# Patient Record
Sex: Female | Born: 1970 | Race: Black or African American | Hispanic: No | Marital: Single | Smoking: Never smoker
Health system: Southern US, Community
[De-identification: ages and names within clinical notes are randomized; demographics above are authoritative.]

## PROBLEM LIST (undated history)

## (undated) DIAGNOSIS — B20 Human immunodeficiency virus [HIV] disease: Secondary | ICD-10-CM

## (undated) DIAGNOSIS — K222 Esophageal obstruction: Secondary | ICD-10-CM

## (undated) DIAGNOSIS — F329 Major depressive disorder, single episode, unspecified: Secondary | ICD-10-CM

## (undated) DIAGNOSIS — K219 Gastro-esophageal reflux disease without esophagitis: Secondary | ICD-10-CM

## (undated) DIAGNOSIS — F32A Depression, unspecified: Secondary | ICD-10-CM

## (undated) DIAGNOSIS — Z21 Asymptomatic human immunodeficiency virus [HIV] infection status: Secondary | ICD-10-CM

## (undated) DIAGNOSIS — I10 Essential (primary) hypertension: Secondary | ICD-10-CM

## (undated) HISTORY — DX: Human immunodeficiency virus (HIV) disease: B20

## (undated) HISTORY — DX: Depression, unspecified: F32.A

## (undated) HISTORY — DX: Major depressive disorder, single episode, unspecified: F32.9

## (undated) HISTORY — DX: Asymptomatic human immunodeficiency virus (hiv) infection status: Z21

---

## 1997-11-23 ENCOUNTER — Ambulatory Visit: Admission: RE | Admit: 1997-11-23 | Discharge: 1997-11-23 | Payer: Self-pay | Admitting: Obstetrics and Gynecology

## 1997-11-29 ENCOUNTER — Encounter: Admission: RE | Admit: 1997-11-29 | Discharge: 1997-11-29 | Payer: Self-pay | Admitting: Infectious Diseases

## 1997-11-29 ENCOUNTER — Ambulatory Visit (HOSPITAL_COMMUNITY): Admission: RE | Admit: 1997-11-29 | Discharge: 1997-11-29 | Payer: Self-pay | Admitting: Infectious Diseases

## 1997-11-29 ENCOUNTER — Encounter (INDEPENDENT_AMBULATORY_CARE_PROVIDER_SITE_OTHER): Payer: Self-pay | Admitting: *Deleted

## 1997-11-29 LAB — CONVERTED CEMR LAB
CD4 Count: 260 microliters
CD4 T Cell Abs: 260

## 1997-12-06 ENCOUNTER — Encounter (HOSPITAL_COMMUNITY): Admission: RE | Admit: 1997-12-06 | Discharge: 1998-03-06 | Payer: Self-pay | Admitting: Obstetrics & Gynecology

## 1997-12-06 ENCOUNTER — Encounter: Admission: RE | Admit: 1997-12-06 | Discharge: 1997-12-06 | Payer: Self-pay | Admitting: Obstetrics & Gynecology

## 1997-12-27 ENCOUNTER — Encounter: Admission: RE | Admit: 1997-12-27 | Discharge: 1997-12-27 | Payer: Self-pay | Admitting: Obstetrics & Gynecology

## 1997-12-27 ENCOUNTER — Ambulatory Visit (HOSPITAL_COMMUNITY): Admission: RE | Admit: 1997-12-27 | Discharge: 1997-12-27 | Payer: Self-pay | Admitting: Obstetrics and Gynecology

## 1997-12-27 ENCOUNTER — Encounter: Payer: Self-pay | Admitting: Obstetrics and Gynecology

## 1998-01-03 ENCOUNTER — Encounter: Admission: RE | Admit: 1998-01-03 | Discharge: 1998-01-03 | Payer: Self-pay | Admitting: Infectious Diseases

## 1998-01-10 ENCOUNTER — Encounter: Admission: RE | Admit: 1998-01-10 | Discharge: 1998-01-10 | Payer: Self-pay | Admitting: Obstetrics & Gynecology

## 1998-01-24 ENCOUNTER — Encounter: Admission: RE | Admit: 1998-01-24 | Discharge: 1998-01-24 | Payer: Self-pay | Admitting: Obstetrics & Gynecology

## 1998-02-14 ENCOUNTER — Encounter: Admission: RE | Admit: 1998-02-14 | Discharge: 1998-02-14 | Payer: Self-pay | Admitting: Obstetrics & Gynecology

## 1998-02-28 ENCOUNTER — Encounter: Admission: RE | Admit: 1998-02-28 | Discharge: 1998-02-28 | Payer: Self-pay | Admitting: Obstetrics & Gynecology

## 1998-03-28 ENCOUNTER — Encounter: Admission: RE | Admit: 1998-03-28 | Discharge: 1998-03-28 | Payer: Self-pay | Admitting: Obstetrics & Gynecology

## 1998-04-02 ENCOUNTER — Encounter: Admission: RE | Admit: 1998-04-02 | Discharge: 1998-04-02 | Payer: Self-pay | Admitting: Infectious Diseases

## 1998-04-05 ENCOUNTER — Ambulatory Visit (HOSPITAL_COMMUNITY): Admission: RE | Admit: 1998-04-05 | Discharge: 1998-04-05 | Payer: Self-pay | Admitting: Obstetrics & Gynecology

## 1998-04-11 ENCOUNTER — Encounter: Admission: RE | Admit: 1998-04-11 | Discharge: 1998-04-11 | Payer: Self-pay | Admitting: Obstetrics & Gynecology

## 1998-04-25 ENCOUNTER — Encounter: Admission: RE | Admit: 1998-04-25 | Discharge: 1998-04-25 | Payer: Self-pay | Admitting: Obstetrics & Gynecology

## 1998-05-03 ENCOUNTER — Encounter: Admission: RE | Admit: 1998-05-03 | Discharge: 1998-05-03 | Payer: Self-pay | Admitting: Obstetrics

## 1998-05-10 ENCOUNTER — Encounter: Admission: RE | Admit: 1998-05-10 | Discharge: 1998-05-10 | Payer: Self-pay | Admitting: Obstetrics

## 1998-05-14 ENCOUNTER — Encounter: Admission: RE | Admit: 1998-05-14 | Discharge: 1998-05-14 | Payer: Self-pay | Admitting: Infectious Diseases

## 1998-05-16 ENCOUNTER — Encounter: Admission: RE | Admit: 1998-05-16 | Discharge: 1998-05-16 | Payer: Self-pay | Admitting: Obstetrics & Gynecology

## 1998-05-23 ENCOUNTER — Encounter (HOSPITAL_COMMUNITY): Admission: RE | Admit: 1998-05-23 | Discharge: 1998-05-30 | Payer: Self-pay | Admitting: Obstetrics & Gynecology

## 1998-05-23 ENCOUNTER — Encounter: Admission: RE | Admit: 1998-05-23 | Discharge: 1998-05-23 | Payer: Self-pay | Admitting: Obstetrics & Gynecology

## 1998-05-28 ENCOUNTER — Inpatient Hospital Stay (HOSPITAL_COMMUNITY): Admission: AD | Admit: 1998-05-28 | Discharge: 1998-05-28 | Payer: Self-pay | Admitting: Obstetrics and Gynecology

## 1998-05-29 ENCOUNTER — Inpatient Hospital Stay (HOSPITAL_COMMUNITY): Admission: AD | Admit: 1998-05-29 | Discharge: 1998-05-31 | Payer: Self-pay | Admitting: Obstetrics

## 1998-06-11 ENCOUNTER — Encounter: Admission: RE | Admit: 1998-06-11 | Discharge: 1998-06-11 | Payer: Self-pay | Admitting: Infectious Diseases

## 1998-07-09 ENCOUNTER — Encounter: Admission: RE | Admit: 1998-07-09 | Discharge: 1998-07-09 | Payer: Self-pay | Admitting: Family Medicine

## 1998-07-16 ENCOUNTER — Ambulatory Visit (HOSPITAL_COMMUNITY): Admission: RE | Admit: 1998-07-16 | Discharge: 1998-07-16 | Payer: Self-pay | Admitting: Infectious Diseases

## 1998-07-30 ENCOUNTER — Ambulatory Visit (HOSPITAL_COMMUNITY): Admission: RE | Admit: 1998-07-30 | Discharge: 1998-07-30 | Payer: Self-pay | Admitting: Infectious Diseases

## 1998-07-30 ENCOUNTER — Encounter: Admission: RE | Admit: 1998-07-30 | Discharge: 1998-07-30 | Payer: Self-pay | Admitting: Infectious Diseases

## 1998-10-01 ENCOUNTER — Ambulatory Visit (HOSPITAL_COMMUNITY): Admission: RE | Admit: 1998-10-01 | Discharge: 1998-10-01 | Payer: Self-pay | Admitting: Infectious Diseases

## 1998-10-01 ENCOUNTER — Encounter: Admission: RE | Admit: 1998-10-01 | Discharge: 1998-10-01 | Payer: Self-pay | Admitting: Infectious Diseases

## 1998-12-28 ENCOUNTER — Ambulatory Visit (HOSPITAL_COMMUNITY): Admission: RE | Admit: 1998-12-28 | Discharge: 1998-12-28 | Payer: Self-pay | Admitting: Infectious Diseases

## 1998-12-28 ENCOUNTER — Encounter: Admission: RE | Admit: 1998-12-28 | Discharge: 1998-12-28 | Payer: Self-pay | Admitting: Infectious Diseases

## 1999-01-23 ENCOUNTER — Encounter: Admission: RE | Admit: 1999-01-23 | Discharge: 1999-01-23 | Payer: Self-pay | Admitting: Infectious Diseases

## 1999-05-27 ENCOUNTER — Ambulatory Visit (HOSPITAL_COMMUNITY): Admission: RE | Admit: 1999-05-27 | Discharge: 1999-05-27 | Payer: Self-pay | Admitting: Obstetrics & Gynecology

## 1999-05-27 ENCOUNTER — Encounter: Admission: RE | Admit: 1999-05-27 | Discharge: 1999-05-27 | Payer: Self-pay | Admitting: Infectious Diseases

## 1999-06-26 ENCOUNTER — Encounter: Admission: RE | Admit: 1999-06-26 | Discharge: 1999-06-26 | Payer: Self-pay | Admitting: Infectious Diseases

## 1999-09-04 ENCOUNTER — Ambulatory Visit (HOSPITAL_COMMUNITY): Admission: RE | Admit: 1999-09-04 | Discharge: 1999-09-04 | Payer: Self-pay | Admitting: Obstetrics & Gynecology

## 1999-09-04 ENCOUNTER — Encounter: Admission: RE | Admit: 1999-09-04 | Discharge: 1999-09-04 | Payer: Self-pay | Admitting: Obstetrics & Gynecology

## 1999-09-18 ENCOUNTER — Encounter: Admission: RE | Admit: 1999-09-18 | Discharge: 1999-09-18 | Payer: Self-pay | Admitting: Infectious Diseases

## 2000-01-21 ENCOUNTER — Ambulatory Visit (HOSPITAL_COMMUNITY): Admission: RE | Admit: 2000-01-21 | Discharge: 2000-01-21 | Payer: Self-pay | Admitting: Infectious Diseases

## 2000-01-21 ENCOUNTER — Encounter: Admission: RE | Admit: 2000-01-21 | Discharge: 2000-01-21 | Payer: Self-pay | Admitting: Infectious Diseases

## 2000-02-04 ENCOUNTER — Encounter: Admission: RE | Admit: 2000-02-04 | Discharge: 2000-02-04 | Payer: Self-pay | Admitting: Infectious Diseases

## 2001-08-02 ENCOUNTER — Ambulatory Visit (HOSPITAL_COMMUNITY): Admission: RE | Admit: 2001-08-02 | Discharge: 2001-08-02 | Payer: Self-pay | Admitting: Infectious Diseases

## 2001-08-02 ENCOUNTER — Encounter: Admission: RE | Admit: 2001-08-02 | Discharge: 2001-08-02 | Payer: Self-pay | Admitting: Infectious Diseases

## 2001-08-16 ENCOUNTER — Encounter: Payer: Self-pay | Admitting: Infectious Diseases

## 2001-08-16 ENCOUNTER — Encounter: Admission: RE | Admit: 2001-08-16 | Discharge: 2001-08-16 | Payer: Self-pay | Admitting: Internal Medicine

## 2001-08-16 ENCOUNTER — Ambulatory Visit (HOSPITAL_COMMUNITY): Admission: RE | Admit: 2001-08-16 | Discharge: 2001-08-16 | Payer: Self-pay | Admitting: Infectious Diseases

## 2001-10-04 ENCOUNTER — Ambulatory Visit (HOSPITAL_COMMUNITY): Admission: RE | Admit: 2001-10-04 | Discharge: 2001-10-04 | Payer: Self-pay | Admitting: Infectious Diseases

## 2001-10-04 ENCOUNTER — Encounter: Admission: RE | Admit: 2001-10-04 | Discharge: 2001-10-04 | Payer: Self-pay | Admitting: Infectious Diseases

## 2001-11-18 ENCOUNTER — Encounter: Admission: RE | Admit: 2001-11-18 | Discharge: 2001-11-18 | Payer: Self-pay | Admitting: Infectious Diseases

## 2002-01-18 ENCOUNTER — Encounter: Admission: RE | Admit: 2002-01-18 | Discharge: 2002-01-18 | Payer: Self-pay | Admitting: Infectious Diseases

## 2002-01-18 ENCOUNTER — Ambulatory Visit (HOSPITAL_COMMUNITY): Admission: RE | Admit: 2002-01-18 | Discharge: 2002-01-18 | Payer: Self-pay | Admitting: Infectious Diseases

## 2002-04-18 ENCOUNTER — Encounter: Admission: RE | Admit: 2002-04-18 | Discharge: 2002-04-18 | Payer: Self-pay | Admitting: Infectious Diseases

## 2002-05-06 ENCOUNTER — Encounter: Admission: RE | Admit: 2002-05-06 | Discharge: 2002-05-06 | Payer: Self-pay | Admitting: Infectious Diseases

## 2002-05-16 ENCOUNTER — Encounter: Admission: RE | Admit: 2002-05-16 | Discharge: 2002-05-16 | Payer: Self-pay | Admitting: Infectious Diseases

## 2002-08-29 ENCOUNTER — Encounter: Admission: RE | Admit: 2002-08-29 | Discharge: 2002-08-29 | Payer: Self-pay | Admitting: Infectious Diseases

## 2002-10-10 ENCOUNTER — Encounter: Admission: RE | Admit: 2002-10-10 | Discharge: 2002-10-10 | Payer: Self-pay | Admitting: Infectious Diseases

## 2002-10-10 ENCOUNTER — Ambulatory Visit (HOSPITAL_COMMUNITY): Admission: RE | Admit: 2002-10-10 | Discharge: 2002-10-10 | Payer: Self-pay | Admitting: Infectious Diseases

## 2002-10-10 ENCOUNTER — Encounter (INDEPENDENT_AMBULATORY_CARE_PROVIDER_SITE_OTHER): Payer: Self-pay | Admitting: Infectious Diseases

## 2003-06-26 ENCOUNTER — Encounter: Admission: RE | Admit: 2003-06-26 | Discharge: 2003-06-26 | Payer: Self-pay | Admitting: Infectious Diseases

## 2003-07-12 ENCOUNTER — Encounter: Admission: RE | Admit: 2003-07-12 | Discharge: 2003-07-12 | Payer: Self-pay | Admitting: Infectious Diseases

## 2003-10-03 ENCOUNTER — Encounter: Admission: RE | Admit: 2003-10-03 | Discharge: 2003-10-03 | Payer: Self-pay | Admitting: Infectious Diseases

## 2003-10-03 ENCOUNTER — Ambulatory Visit (HOSPITAL_COMMUNITY): Admission: RE | Admit: 2003-10-03 | Discharge: 2003-10-03 | Payer: Self-pay | Admitting: Infectious Diseases

## 2003-10-25 ENCOUNTER — Ambulatory Visit: Payer: Self-pay | Admitting: Infectious Diseases

## 2003-12-05 ENCOUNTER — Ambulatory Visit: Payer: Self-pay | Admitting: Infectious Diseases

## 2004-02-13 ENCOUNTER — Ambulatory Visit (HOSPITAL_COMMUNITY): Admission: RE | Admit: 2004-02-13 | Discharge: 2004-02-13 | Payer: Self-pay | Admitting: Infectious Diseases

## 2004-02-13 ENCOUNTER — Ambulatory Visit: Payer: Self-pay | Admitting: Infectious Diseases

## 2004-04-01 ENCOUNTER — Ambulatory Visit: Payer: Self-pay | Admitting: Infectious Diseases

## 2004-07-30 ENCOUNTER — Ambulatory Visit: Payer: Self-pay | Admitting: Infectious Diseases

## 2004-07-30 ENCOUNTER — Ambulatory Visit (HOSPITAL_COMMUNITY): Admission: RE | Admit: 2004-07-30 | Discharge: 2004-07-30 | Payer: Self-pay | Admitting: Infectious Diseases

## 2004-08-26 ENCOUNTER — Ambulatory Visit: Payer: Self-pay | Admitting: Infectious Diseases

## 2004-11-11 ENCOUNTER — Ambulatory Visit: Payer: Self-pay | Admitting: Infectious Diseases

## 2004-11-11 ENCOUNTER — Ambulatory Visit (HOSPITAL_COMMUNITY): Admission: RE | Admit: 2004-11-11 | Discharge: 2004-11-11 | Payer: Self-pay | Admitting: Infectious Diseases

## 2005-01-27 ENCOUNTER — Ambulatory Visit: Payer: Self-pay | Admitting: Infectious Diseases

## 2005-05-14 ENCOUNTER — Encounter (INDEPENDENT_AMBULATORY_CARE_PROVIDER_SITE_OTHER): Payer: Self-pay | Admitting: *Deleted

## 2005-05-14 ENCOUNTER — Ambulatory Visit: Payer: Self-pay | Admitting: Infectious Diseases

## 2005-05-14 ENCOUNTER — Encounter: Admission: RE | Admit: 2005-05-14 | Discharge: 2005-05-14 | Payer: Self-pay | Admitting: Infectious Diseases

## 2005-05-14 LAB — CONVERTED CEMR LAB
CD4 Count: 570 microliters
HIV 1 RNA Quant: 49 copies/mL

## 2005-06-09 ENCOUNTER — Ambulatory Visit: Payer: Self-pay | Admitting: Infectious Diseases

## 2005-09-29 ENCOUNTER — Ambulatory Visit: Payer: Self-pay | Admitting: Infectious Diseases

## 2005-09-29 ENCOUNTER — Encounter: Admission: RE | Admit: 2005-09-29 | Discharge: 2005-09-29 | Payer: Self-pay | Admitting: Infectious Diseases

## 2005-09-29 ENCOUNTER — Encounter (INDEPENDENT_AMBULATORY_CARE_PROVIDER_SITE_OTHER): Payer: Self-pay | Admitting: *Deleted

## 2005-09-29 LAB — CONVERTED CEMR LAB
CD4 Count: 550 microliters
HIV 1 RNA Quant: 71 copies/mL

## 2005-11-03 ENCOUNTER — Ambulatory Visit: Payer: Self-pay | Admitting: Infectious Diseases

## 2005-11-19 DIAGNOSIS — B20 Human immunodeficiency virus [HIV] disease: Secondary | ICD-10-CM | POA: Insufficient documentation

## 2006-02-23 ENCOUNTER — Encounter (INDEPENDENT_AMBULATORY_CARE_PROVIDER_SITE_OTHER): Payer: Self-pay | Admitting: *Deleted

## 2006-02-23 ENCOUNTER — Ambulatory Visit: Payer: Self-pay | Admitting: Infectious Diseases

## 2006-02-23 ENCOUNTER — Encounter: Admission: RE | Admit: 2006-02-23 | Discharge: 2006-02-23 | Payer: Self-pay | Admitting: Infectious Diseases

## 2006-02-23 LAB — CONVERTED CEMR LAB
ALT: 14 units/L (ref 0–35)
AST: 14 units/L (ref 0–37)
Albumin: 4 g/dL (ref 3.5–5.2)
Alkaline Phosphatase: 82 units/L (ref 39–117)
BUN: 12 mg/dL (ref 6–23)
Basophils Absolute: 0 10*3/uL (ref 0.0–0.1)
Basophils Relative: 0 % (ref 0–1)
Bilirubin Urine: NEGATIVE
CD4 Count: 780 microliters
CO2: 24 meq/L (ref 19–32)
Calcium: 9.1 mg/dL (ref 8.4–10.5)
Chloride: 103 meq/L (ref 96–112)
Cholesterol: 205 mg/dL — ABNORMAL HIGH (ref 0–200)
Creatinine, Ser: 0.85 mg/dL (ref 0.40–1.20)
Eosinophils Relative: 2 % (ref 0–5)
Glucose, Bld: 114 mg/dL — ABNORMAL HIGH (ref 70–99)
HCT: 39.9 % (ref 36.0–46.0)
HDL: 39 mg/dL — ABNORMAL LOW (ref >39)
HIV 1 RNA Quant: 549 copies/mL
HIV 1 RNA Quant: 549 copies/mL — ABNORMAL HIGH (ref <50)
HIV-1 RNA Quant, Log: 2.74 — ABNORMAL HIGH (ref <1.70)
Hemoglobin, Urine: NEGATIVE
Hemoglobin: 12.3 g/dL (ref 12.0–15.0)
LDL Cholesterol: 135 mg/dL — ABNORMAL HIGH (ref 0–99)
Lymphocytes Relative: 47 % — ABNORMAL HIGH (ref 12–46)
Lymphs Abs: 3.2 10*3/uL (ref 0.7–3.3)
MCHC: 30.8 g/dL (ref 30.0–36.0)
MCV: 83.6 fL (ref 78.0–100.0)
Monocytes Absolute: 0.5 10*3/uL (ref 0.2–0.7)
Monocytes Relative: 7 % (ref 3–11)
Neutro Abs: 3 10*3/uL (ref 1.7–7.7)
Neutrophils Relative %: 44 % (ref 43–77)
Nitrite: NEGATIVE
Platelets: 312 10*3/uL (ref 150–400)
Potassium: 4.3 meq/L (ref 3.5–5.3)
Protein, ur: NEGATIVE mg/dL
RBC / HPF: NONE SEEN (ref <3)
RBC: 4.77 M/uL (ref 3.87–5.11)
RDW: 14.7 % — ABNORMAL HIGH (ref 11.5–14.0)
Sodium: 139 meq/L (ref 135–145)
Specific Gravity, Urine: 1.026 (ref 1.005–1.03)
Total Bilirubin: 0.3 mg/dL (ref 0.3–1.2)
Total CHOL/HDL Ratio: 5.3
Total Protein: 7.7 g/dL (ref 6.0–8.3)
Triglycerides: 154 mg/dL — ABNORMAL HIGH (ref <150)
Urine Glucose: NEGATIVE mg/dL
Urobilinogen, UA: 0.2 (ref 0.0–1.0)
VLDL: 31 mg/dL (ref 0–40)
WBC: 6.8 10*3/uL (ref 4.0–10.5)
pH: 6 (ref 5.0–8.0)

## 2006-03-16 ENCOUNTER — Ambulatory Visit: Payer: Self-pay | Admitting: Infectious Diseases

## 2006-04-13 ENCOUNTER — Encounter (INDEPENDENT_AMBULATORY_CARE_PROVIDER_SITE_OTHER): Payer: Self-pay | Admitting: *Deleted

## 2006-04-13 LAB — CONVERTED CEMR LAB

## 2006-04-26 ENCOUNTER — Encounter (INDEPENDENT_AMBULATORY_CARE_PROVIDER_SITE_OTHER): Payer: Self-pay | Admitting: *Deleted

## 2006-12-11 ENCOUNTER — Ambulatory Visit: Payer: Self-pay | Admitting: Internal Medicine

## 2006-12-11 ENCOUNTER — Encounter: Admission: RE | Admit: 2006-12-11 | Discharge: 2006-12-11 | Payer: Self-pay | Admitting: Internal Medicine

## 2006-12-11 LAB — CONVERTED CEMR LAB
ALT: 13 units/L (ref 0–35)
AST: 13 units/L (ref 0–37)
Albumin: 3.7 g/dL (ref 3.5–5.2)
Alkaline Phosphatase: 76 units/L (ref 39–117)
BUN: 8 mg/dL (ref 6–23)
Basophils Absolute: 0 10*3/uL (ref 0.0–0.1)
Basophils Relative: 0 % (ref 0–1)
CO2: 25 meq/L (ref 19–32)
Calcium: 8.5 mg/dL (ref 8.4–10.5)
Chloride: 102 meq/L (ref 96–112)
Creatinine, Ser: 0.75 mg/dL (ref 0.40–1.20)
Eosinophils Absolute: 0.1 10*3/uL (ref 0.0–0.7)
Eosinophils Relative: 2 % (ref 0–5)
Glucose, Bld: 152 mg/dL — ABNORMAL HIGH (ref 70–99)
HCT: 38.3 % (ref 36.0–46.0)
HIV 1 RNA Quant: 326 copies/mL — ABNORMAL HIGH (ref <50)
HIV-1 RNA Quant, Log: 2.51 — ABNORMAL HIGH (ref <1.70)
Hemoglobin: 12.4 g/dL (ref 12.0–15.0)
Hep B Core Total Ab: NEGATIVE
Lymphocytes Relative: 48 % — ABNORMAL HIGH (ref 12–46)
Lymphs Abs: 2.9 10*3/uL (ref 0.7–3.3)
MCHC: 32.4 g/dL (ref 30.0–36.0)
MCV: 80.8 fL (ref 78.0–100.0)
Monocytes Absolute: 0.5 10*3/uL (ref 0.2–0.7)
Monocytes Relative: 8 % (ref 3–11)
Neutro Abs: 2.6 10*3/uL (ref 1.7–7.7)
Neutrophils Relative %: 42 % — ABNORMAL LOW (ref 43–77)
Platelets: 320 10*3/uL (ref 150–400)
Potassium: 3.9 meq/L (ref 3.5–5.3)
RBC: 4.74 M/uL (ref 3.87–5.11)
RDW: 14.4 % — ABNORMAL HIGH (ref 11.5–14.0)
Sodium: 138 meq/L (ref 135–145)
Total Bilirubin: 0.3 mg/dL (ref 0.3–1.2)
Total Protein: 7.1 g/dL (ref 6.0–8.3)
WBC: 6.1 10*3/uL (ref 4.0–10.5)

## 2007-01-19 ENCOUNTER — Ambulatory Visit: Payer: Self-pay | Admitting: Internal Medicine

## 2007-01-19 DIAGNOSIS — I10 Essential (primary) hypertension: Secondary | ICD-10-CM | POA: Insufficient documentation

## 2007-01-19 DIAGNOSIS — F32A Depression, unspecified: Secondary | ICD-10-CM | POA: Insufficient documentation

## 2007-01-19 DIAGNOSIS — R7309 Other abnormal glucose: Secondary | ICD-10-CM | POA: Insufficient documentation

## 2007-01-19 DIAGNOSIS — F329 Major depressive disorder, single episode, unspecified: Secondary | ICD-10-CM | POA: Insufficient documentation

## 2007-01-27 ENCOUNTER — Telehealth: Payer: Self-pay | Admitting: Internal Medicine

## 2007-02-18 HISTORY — PX: GASTRIC BYPASS: SHX52

## 2007-03-03 ENCOUNTER — Telehealth: Payer: Self-pay | Admitting: Internal Medicine

## 2007-03-03 ENCOUNTER — Telehealth (INDEPENDENT_AMBULATORY_CARE_PROVIDER_SITE_OTHER): Payer: Self-pay | Admitting: *Deleted

## 2007-03-19 ENCOUNTER — Telehealth: Payer: Self-pay | Admitting: Internal Medicine

## 2007-04-29 ENCOUNTER — Encounter (INDEPENDENT_AMBULATORY_CARE_PROVIDER_SITE_OTHER): Payer: Self-pay | Admitting: *Deleted

## 2007-05-07 ENCOUNTER — Encounter: Admission: RE | Admit: 2007-05-07 | Discharge: 2007-05-07 | Payer: Self-pay | Admitting: Internal Medicine

## 2007-05-07 ENCOUNTER — Encounter: Payer: Self-pay | Admitting: Internal Medicine

## 2007-05-07 ENCOUNTER — Ambulatory Visit: Payer: Self-pay | Admitting: Internal Medicine

## 2007-05-07 LAB — CONVERTED CEMR LAB
ALT: 19 units/L (ref 0–35)
AST: 17 units/L (ref 0–37)
Albumin: 3.8 g/dL (ref 3.5–5.2)
Alkaline Phosphatase: 52 units/L (ref 39–117)
BUN: 8 mg/dL (ref 6–23)
Basophils Absolute: 0 10*3/uL (ref 0.0–0.1)
Basophils Relative: 1 % (ref 0–1)
CO2: 25 meq/L (ref 19–32)
Calcium: 8.4 mg/dL (ref 8.4–10.5)
Chloride: 105 meq/L (ref 96–112)
Creatinine, Ser: 0.86 mg/dL (ref 0.40–1.20)
Eosinophils Absolute: 0 10*3/uL (ref 0.0–0.7)
Eosinophils Relative: 1 % (ref 0–5)
Glucose, Bld: 88 mg/dL (ref 70–99)
HCT: 37.6 % (ref 36.0–46.0)
HIV 1 RNA Quant: 19900 copies/mL — ABNORMAL HIGH (ref <50)
HIV-1 RNA Quant, Log: 4.3 — ABNORMAL HIGH (ref <1.70)
Hemoglobin: 12 g/dL (ref 12.0–15.0)
Lymphocytes Relative: 60 % — ABNORMAL HIGH (ref 12–46)
Lymphs Abs: 2.5 10*3/uL (ref 0.7–4.0)
MCHC: 31.9 g/dL (ref 30.0–36.0)
MCV: 80.7 fL (ref 78.0–100.0)
Monocytes Absolute: 0.8 10*3/uL (ref 0.1–1.0)
Monocytes Relative: 18 % — ABNORMAL HIGH (ref 3–12)
Neutro Abs: 0.8 10*3/uL — ABNORMAL LOW (ref 1.7–7.7)
Neutrophils Relative %: 20 % — ABNORMAL LOW (ref 43–77)
Pap Smear: NORMAL
Platelets: 264 10*3/uL (ref 150–400)
Potassium: 4.3 meq/L (ref 3.5–5.3)
RBC: 4.66 M/uL (ref 3.87–5.11)
RDW: 15.3 % (ref 11.5–15.5)
Sodium: 140 meq/L (ref 135–145)
Total Bilirubin: 0.2 mg/dL — ABNORMAL LOW (ref 0.3–1.2)
Total Protein: 7.6 g/dL (ref 6.0–8.3)
WBC: 4.1 10*3/uL (ref 4.0–10.5)

## 2007-05-17 ENCOUNTER — Encounter: Payer: Self-pay | Admitting: Internal Medicine

## 2007-05-21 ENCOUNTER — Ambulatory Visit: Payer: Self-pay | Admitting: Internal Medicine

## 2007-06-04 ENCOUNTER — Telehealth: Payer: Self-pay | Admitting: Internal Medicine

## 2007-06-04 ENCOUNTER — Telehealth: Payer: Self-pay

## 2007-09-08 ENCOUNTER — Encounter (INDEPENDENT_AMBULATORY_CARE_PROVIDER_SITE_OTHER): Payer: Self-pay | Admitting: *Deleted

## 2007-12-16 ENCOUNTER — Ambulatory Visit: Payer: Self-pay | Admitting: Internal Medicine

## 2007-12-16 LAB — CONVERTED CEMR LAB
ALT: 15 units/L (ref 0–35)
AST: 14 units/L (ref 0–37)
Albumin: 3.7 g/dL (ref 3.5–5.2)
Alkaline Phosphatase: 53 units/L (ref 39–117)
BUN: 13 mg/dL (ref 6–23)
Basophils Absolute: 0 10*3/uL (ref 0.0–0.1)
Basophils Relative: 0 % (ref 0–1)
CO2: 26 meq/L (ref 19–32)
Calcium: 9.2 mg/dL (ref 8.4–10.5)
Chloride: 102 meq/L (ref 96–112)
Cholesterol: 220 mg/dL — ABNORMAL HIGH (ref 0–200)
Creatinine, Ser: 0.81 mg/dL (ref 0.40–1.20)
Eosinophils Absolute: 0 10*3/uL (ref 0.0–0.7)
Eosinophils Relative: 1 % (ref 0–5)
Glucose, Bld: 140 mg/dL — ABNORMAL HIGH (ref 70–99)
HCT: 36.7 % (ref 36.0–46.0)
HDL: 40 mg/dL (ref >39)
HIV 1 RNA Quant: 410 copies/mL — ABNORMAL HIGH (ref <50)
HIV-1 RNA Quant, Log: 2.61 — ABNORMAL HIGH (ref <1.70)
Hemoglobin: 11.4 g/dL — ABNORMAL LOW (ref 12.0–15.0)
Hgb A1c MFr Bld: 7 %
LDL Cholesterol: 151 mg/dL — ABNORMAL HIGH (ref 0–99)
Lymphocytes Relative: 52 % — ABNORMAL HIGH (ref 12–46)
Lymphs Abs: 2.8 10*3/uL (ref 0.7–4.0)
MCHC: 31.1 g/dL (ref 30.0–36.0)
MCV: 83 fL (ref 78.0–100.0)
Monocytes Absolute: 0.5 10*3/uL (ref 0.1–1.0)
Monocytes Relative: 9 % (ref 3–12)
Neutro Abs: 2.1 10*3/uL (ref 1.7–7.7)
Neutrophils Relative %: 39 % — ABNORMAL LOW (ref 43–77)
Platelets: 335 10*3/uL (ref 150–400)
Potassium: 4.7 meq/L (ref 3.5–5.3)
RBC: 4.42 M/uL (ref 3.87–5.11)
RDW: 14.6 % (ref 11.5–15.5)
Sodium: 138 meq/L (ref 135–145)
Total Bilirubin: 0.3 mg/dL (ref 0.3–1.2)
Total CHOL/HDL Ratio: 5.5
Total Protein: 7.6 g/dL (ref 6.0–8.3)
Triglycerides: 146 mg/dL (ref <150)
VLDL: 29 mg/dL (ref 0–40)
WBC: 5.3 10*3/uL (ref 4.0–10.5)

## 2007-12-28 ENCOUNTER — Encounter: Payer: Self-pay | Admitting: Internal Medicine

## 2008-01-04 ENCOUNTER — Ambulatory Visit: Payer: Self-pay | Admitting: Internal Medicine

## 2008-01-04 DIAGNOSIS — L293 Anogenital pruritus, unspecified: Secondary | ICD-10-CM | POA: Insufficient documentation

## 2008-01-04 DIAGNOSIS — E785 Hyperlipidemia, unspecified: Secondary | ICD-10-CM | POA: Insufficient documentation

## 2008-02-16 ENCOUNTER — Encounter: Payer: Self-pay | Admitting: Internal Medicine

## 2008-05-17 ENCOUNTER — Encounter: Payer: Self-pay | Admitting: Internal Medicine

## 2008-05-17 ENCOUNTER — Telehealth (INDEPENDENT_AMBULATORY_CARE_PROVIDER_SITE_OTHER): Payer: Self-pay | Admitting: *Deleted

## 2008-05-26 ENCOUNTER — Encounter: Payer: Self-pay | Admitting: Internal Medicine

## 2008-05-26 ENCOUNTER — Ambulatory Visit: Payer: Self-pay | Admitting: Internal Medicine

## 2008-05-26 LAB — CONVERTED CEMR LAB
ALT: 12 units/L (ref 0–35)
AST: 12 units/L (ref 0–37)
Albumin: 3.6 g/dL (ref 3.5–5.2)
Alkaline Phosphatase: 63 units/L (ref 39–117)
BUN: 11 mg/dL (ref 6–23)
Basophils Absolute: 0 10*3/uL (ref 0.0–0.1)
Basophils Relative: 0 % (ref 0–1)
CO2: 27 meq/L (ref 19–32)
Calcium: 8.7 mg/dL (ref 8.4–10.5)
Chloride: 101 meq/L (ref 96–112)
Creatinine, Ser: 0.84 mg/dL (ref 0.40–1.20)
Eosinophils Absolute: 0.2 10*3/uL (ref 0.0–0.7)
Eosinophils Relative: 3 % (ref 0–5)
GFR calc Af Amer: >60 mL/min (ref >60)
GFR calc non Af Amer: >60 mL/min (ref >60)
Glucose, Bld: 131 mg/dL — ABNORMAL HIGH (ref 70–99)
HCT: 35.5 % — ABNORMAL LOW (ref 36.0–46.0)
HIV 1 RNA Quant: <48 copies/mL (ref <48)
HIV-1 RNA Quant, Log: <1.68 (ref <1.68)
Hemoglobin: 11.4 g/dL — ABNORMAL LOW (ref 12.0–15.0)
Lymphocytes Relative: 36 % (ref 12–46)
Lymphs Abs: 2.6 10*3/uL (ref 0.7–4.0)
MCHC: 32.1 g/dL (ref 30.0–36.0)
MCV: 82.6 fL (ref 78.0–100.0)
Monocytes Absolute: 0.8 10*3/uL (ref 0.1–1.0)
Monocytes Relative: 10 % (ref 3–12)
Neutro Abs: 3.8 10*3/uL (ref 1.7–7.7)
Neutrophils Relative %: 51 % (ref 43–77)
Platelets: 332 10*3/uL (ref 150–400)
Potassium: 4 meq/L (ref 3.5–5.3)
RBC: 4.3 M/uL (ref 3.87–5.11)
RDW: 15.2 % (ref 11.5–15.5)
Sodium: 137 meq/L (ref 135–145)
Total Bilirubin: 0.2 mg/dL — ABNORMAL LOW (ref 0.3–1.2)
Total Protein: 7.3 g/dL (ref 6.0–8.3)
WBC: 7.4 10*3/uL (ref 4.0–10.5)

## 2008-06-02 ENCOUNTER — Encounter: Payer: Self-pay | Admitting: Internal Medicine

## 2008-06-02 ENCOUNTER — Telehealth: Payer: Self-pay | Admitting: Internal Medicine

## 2008-06-06 ENCOUNTER — Encounter: Payer: Self-pay | Admitting: Internal Medicine

## 2008-06-09 ENCOUNTER — Ambulatory Visit: Payer: Self-pay | Admitting: Internal Medicine

## 2008-06-09 DIAGNOSIS — E669 Obesity, unspecified: Secondary | ICD-10-CM | POA: Insufficient documentation

## 2008-06-28 ENCOUNTER — Encounter: Payer: Self-pay | Admitting: Internal Medicine

## 2008-09-06 ENCOUNTER — Ambulatory Visit: Payer: Self-pay | Admitting: Internal Medicine

## 2008-09-06 LAB — CONVERTED CEMR LAB
ALT: 12 units/L (ref 0–35)
AST: 12 units/L (ref 0–37)
Albumin: 3.7 g/dL (ref 3.5–5.2)
Alkaline Phosphatase: 52 units/L (ref 39–117)
BUN: 12 mg/dL (ref 6–23)
Basophils Absolute: 0 10*3/uL (ref 0.0–0.1)
Basophils Relative: 0 % (ref 0–1)
CO2: 25 meq/L (ref 19–32)
Calcium: 9.1 mg/dL (ref 8.4–10.5)
Chloride: 103 meq/L (ref 96–112)
Creatinine, Ser: 1.02 mg/dL (ref 0.40–1.20)
Eosinophils Absolute: 0.1 10*3/uL (ref 0.0–0.7)
Eosinophils Relative: 1 % (ref 0–5)
Glucose, Bld: 190 mg/dL — ABNORMAL HIGH (ref 70–99)
HCT: 36.2 % (ref 36.0–46.0)
HIV 1 RNA Quant: 107 copies/mL — ABNORMAL HIGH (ref <48)
HIV-1 RNA Quant, Log: 2.03 — ABNORMAL HIGH (ref <1.68)
Hemoglobin: 11.7 g/dL — ABNORMAL LOW (ref 12.0–15.0)
Lymphocytes Relative: 40 % (ref 12–46)
Lymphs Abs: 3 10*3/uL (ref 0.7–4.0)
MCHC: 32.3 g/dL (ref 30.0–36.0)
MCV: 82.6 fL (ref 78.0–100.0)
Monocytes Absolute: 0.6 10*3/uL (ref 0.1–1.0)
Monocytes Relative: 8 % (ref 3–12)
Neutro Abs: 4 10*3/uL (ref 1.7–7.7)
Neutrophils Relative %: 52 % (ref 43–77)
Platelets: 333 10*3/uL (ref 150–400)
Potassium: 4.3 meq/L (ref 3.5–5.3)
RBC: 4.38 M/uL (ref 3.87–5.11)
RDW: 15.5 % (ref 11.5–15.5)
Sodium: 137 meq/L (ref 135–145)
Total Bilirubin: 0.3 mg/dL (ref 0.3–1.2)
Total Protein: 7.3 g/dL (ref 6.0–8.3)
WBC: 7.6 10*3/uL (ref 4.0–10.5)

## 2008-09-22 ENCOUNTER — Encounter (INDEPENDENT_AMBULATORY_CARE_PROVIDER_SITE_OTHER): Payer: Self-pay | Admitting: *Deleted

## 2008-09-22 ENCOUNTER — Ambulatory Visit: Payer: Self-pay | Admitting: Internal Medicine

## 2009-01-05 ENCOUNTER — Encounter: Payer: Self-pay | Admitting: Internal Medicine

## 2009-01-09 ENCOUNTER — Ambulatory Visit: Payer: Self-pay | Admitting: Internal Medicine

## 2009-01-09 LAB — CONVERTED CEMR LAB
HIV 1 RNA Quant: <48 copies/mL (ref <48)
HIV-1 RNA Quant, Log: <1.68 (ref <1.68)

## 2009-01-24 ENCOUNTER — Ambulatory Visit: Payer: Self-pay | Admitting: Obstetrics and Gynecology

## 2009-01-24 ENCOUNTER — Encounter: Payer: Self-pay | Admitting: Internal Medicine

## 2009-01-24 ENCOUNTER — Encounter (INDEPENDENT_AMBULATORY_CARE_PROVIDER_SITE_OTHER): Payer: Self-pay | Admitting: *Deleted

## 2009-01-24 LAB — CONVERTED CEMR LAB
Pap Smear: NEGATIVE
Pap Smear: NORMAL

## 2009-01-31 ENCOUNTER — Encounter (INDEPENDENT_AMBULATORY_CARE_PROVIDER_SITE_OTHER): Payer: Self-pay | Admitting: *Deleted

## 2009-02-02 ENCOUNTER — Encounter (INDEPENDENT_AMBULATORY_CARE_PROVIDER_SITE_OTHER): Payer: Self-pay | Admitting: *Deleted

## 2009-03-15 ENCOUNTER — Encounter: Payer: Self-pay | Admitting: Internal Medicine

## 2009-03-21 ENCOUNTER — Encounter: Payer: Self-pay | Admitting: Internal Medicine

## 2009-06-14 ENCOUNTER — Ambulatory Visit: Payer: Self-pay | Admitting: Internal Medicine

## 2009-06-14 LAB — CONVERTED CEMR LAB
ALT: 12 units/L (ref 0–35)
AST: 19 units/L (ref 0–37)
Albumin: 3.2 g/dL — ABNORMAL LOW (ref 3.5–5.2)
Alkaline Phosphatase: 40 units/L (ref 39–117)
BUN: 9 mg/dL (ref 6–23)
Basophils Absolute: 0 10*3/uL (ref 0.0–0.1)
Basophils Relative: 0 % (ref 0–1)
CO2: 24 meq/L (ref 19–32)
Calcium: 8.5 mg/dL (ref 8.4–10.5)
Chloride: 106 meq/L (ref 96–112)
Creatinine, Ser: 0.77 mg/dL (ref 0.40–1.20)
Eosinophils Absolute: 0.1 10*3/uL (ref 0.0–0.7)
Eosinophils Relative: 2 % (ref 0–5)
Glucose, Bld: 101 mg/dL — ABNORMAL HIGH (ref 70–99)
HCT: 31 % — ABNORMAL LOW (ref 36.0–46.0)
HIV 1 RNA Quant: 8790 copies/mL — ABNORMAL HIGH (ref <48)
HIV-1 RNA Quant, Log: 3.94 — ABNORMAL HIGH (ref <1.68)
Hemoglobin: 10.3 g/dL — ABNORMAL LOW (ref 12.0–15.0)
Lymphocytes Relative: 72 % — ABNORMAL HIGH (ref 12–46)
Lymphs Abs: 2.9 10*3/uL (ref 0.7–4.0)
MCHC: 33.2 g/dL (ref 30.0–36.0)
MCV: 74.7 fL — ABNORMAL LOW (ref 78.0–100.0)
Monocytes Absolute: 0.5 10*3/uL (ref 0.1–1.0)
Monocytes Relative: 12 % (ref 3–12)
Neutro Abs: 0.6 10*3/uL — ABNORMAL LOW (ref 1.7–7.7)
Neutrophils Relative %: 14 % — ABNORMAL LOW (ref 43–77)
Platelets: 275 10*3/uL (ref 150–400)
Potassium: 4.4 meq/L (ref 3.5–5.3)
RBC: 4.15 M/uL (ref 3.87–5.11)
RDW: 16.9 % — ABNORMAL HIGH (ref 11.5–15.5)
Sodium: 139 meq/L (ref 135–145)
Total Bilirubin: 0.4 mg/dL (ref 0.3–1.2)
Total Protein: 7.1 g/dL (ref 6.0–8.3)
WBC: 4.1 10*3/uL (ref 4.0–10.5)

## 2009-06-29 ENCOUNTER — Ambulatory Visit: Payer: Self-pay | Admitting: Internal Medicine

## 2009-06-29 DIAGNOSIS — K222 Esophageal obstruction: Secondary | ICD-10-CM | POA: Insufficient documentation

## 2009-08-02 ENCOUNTER — Emergency Department (HOSPITAL_COMMUNITY): Admission: EM | Admit: 2009-08-02 | Discharge: 2009-08-02 | Payer: Self-pay | Admitting: Emergency Medicine

## 2009-09-22 ENCOUNTER — Emergency Department (HOSPITAL_COMMUNITY): Admission: EM | Admit: 2009-09-22 | Discharge: 2009-09-22 | Payer: Self-pay | Admitting: Family Medicine

## 2009-10-11 ENCOUNTER — Encounter: Payer: Self-pay | Admitting: Internal Medicine

## 2009-10-16 ENCOUNTER — Encounter: Payer: Self-pay | Admitting: Internal Medicine

## 2009-10-19 ENCOUNTER — Encounter: Payer: Self-pay | Admitting: Internal Medicine

## 2010-01-04 ENCOUNTER — Telehealth: Payer: Self-pay

## 2010-01-09 ENCOUNTER — Ambulatory Visit: Payer: Self-pay | Admitting: Internal Medicine

## 2010-01-14 ENCOUNTER — Telehealth (INDEPENDENT_AMBULATORY_CARE_PROVIDER_SITE_OTHER): Payer: Self-pay | Admitting: *Deleted

## 2010-01-14 ENCOUNTER — Encounter: Payer: Self-pay | Admitting: Internal Medicine

## 2010-01-15 ENCOUNTER — Encounter (INDEPENDENT_AMBULATORY_CARE_PROVIDER_SITE_OTHER): Payer: Self-pay | Admitting: *Deleted

## 2010-02-26 ENCOUNTER — Ambulatory Visit: Admit: 2010-02-26 | Payer: Self-pay | Admitting: Internal Medicine

## 2010-03-12 ENCOUNTER — Ambulatory Visit: Admit: 2010-03-12 | Payer: Self-pay | Admitting: Internal Medicine

## 2010-03-21 NOTE — Progress Notes (Signed)
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

## 2010-03-21 NOTE — Progress Notes (Signed)
Summary: Printed all Rxs to be faxed to [facility] 03-03-07/mld  Phone Note Call from Patient Call back at [facility] Phone [phone]   Caller: Patient Reason for Call: Refill Medication Details for Reason: New insurance Summary of Call: Patient left a message on voicemail stating that she would like her medications be faxed  in to [facility] mail order pharmacy at [phone].  Please call patient to let her know once done. Initial call taken by: Paulo Fruit,  March 03, 2007 8:51 AM      Prescriptions: HYDROCHLOROTHIAZIDE 25 MG  TABS (HYDROCHLOROTHIAZIDE) Take 1 tablet by mouth once a day  #30 x 5   Entered by:   Paulo Fruit   Authorized by:   Yisroel Ramming MD   Signed by:   Paulo Fruit on 03/03/2007   Method used:   Printed then faxed to ...         RxID:   [id number] CELEXA 20 MG  TABS (CITALOPRAM HYDROBROMIDE) Take 1 tablet by mouth once a day  #30 x 5   Entered by:   Paulo Fruit   Authorized by:   Yisroel Ramming MD   Signed by:   Paulo Fruit on 03/03/2007   Method used:   Printed then faxed to ...         RxID:   [id number] KALETRA 200-50 MG TABS (LOPINAVIR-RITONAVIR) Two twice a day  #120 x 5   Entered by:   Paulo Fruit   Authorized by:   Yisroel Ramming MD   Signed by:   Paulo Fruit on 03/03/2007   Method used:   Printed then faxed to ...         RxID:   [id number] TRUVADA 200-300 MG TABS (EMTRICITABINE-TENOFOVIR) One pill a day  #30 x 5   Entered by:   Paulo Fruit   Authorized by:   Yisroel Ramming MD   Signed by:   Paulo Fruit on 03/03/2007   Method used:   Printed then faxed to ...         RxID:   [id number]  ...................................................................Paulo Fruit  March 03, 2007 8:52 AM

## 2010-03-21 NOTE — Letter (Signed)
Summary: Generic Letter  [facility]  [address]   [place], [place] [address]   Phone: [phone]  Fax: [phone]    09/22/2008  [facility] [address] [place], [place]  [address]  Dear Ms. Betzold,  I have tried to call you with success.  Please call [facility] at [phone] to reschedule your follow-up appointment for the abnormal PAP smear results from April 2010.    Thank you.  Sincerely,   Jennet Maduro RN

## 2010-03-21 NOTE — Miscellaneous (Signed)
Summary: [facility]   Imported By: Florinda Marker 10/19/2009 15:23:20  _____________________________________________________________________  External Attachment:    Type:   Image     Comment:   External Document

## 2010-03-21 NOTE — Progress Notes (Signed)
Summary: New Regimen phoned in. 06-08-07  ---- Converted from flag ---- ---- 06/02/2007 3:00 PM, Yisroel Ramming MD wrote: sent phone note to ID triage  ---- 06/02/2007 1:05 PM, Jennet Maduro RN wrote: Please let me know about starting the pt. on medications.  Thank you, [name] ------------------------------  Phone Note Outgoing Call   Call placed by: Paulo Fruit,  June 04, 2007 11:30 AM Call placed to: Patient Summary of Call: Left message on patient's vm for her to contact [name] at [phone] at her convience today.  I need to let her know that Dr. Philipp Deputy has changed her regimen and I need to know where she would like for her medications to be called into.  I also have co-pay assist cards available for 2 of the 3 new medications.  Follow-up for Phone Call        Patient called back this morning.  She is presently waiting to hear from her specialty pharmacy (mail order) about being able to use a copay assist card.  Patient will call me back between today and tomorrow to let me know before medicatin is called in for her. Follow-up by: Paulo Fruit,  June 07, 2007 12:10 PM  Additional Follow-up for Phone Call Additional follow up Details #1::        Ariel Cox called back and infomed me that she will according to [facility] pharmacy be able to use the copay assist cards.  She wants me to go ahead and call in her new regimen.  She will call before she comes to the clinic to pickup the copay assist cards.  She is aware that there is no copay assist card for the Norvir as of yet.   Additional Follow-up by: Paulo Fruit,  June 08, 2007 11:06 AM    New/Updated Medications: LEXIVA 700 MG  TABS (FOSAMPRENAVIR CALCIUM) Take 2 tablets by mouth twice a day EPZICOM 600-300 MG  TABS (ABACAVIR SULFATE-LAMIVUDINE) Take 1 tablet by mouth once a day NORVIR 100 MG  CAPS (RITONAVIR) Take 1 capsule by mouth two times a day   Prescriptions: NORVIR 100 MG  CAPS (RITONAVIR) Take 1 capsule by  mouth two times a day  #60 x 6   Entered by:   Paulo Fruit   Authorized by:   Yisroel Ramming MD   Signed by:   Paulo Fruit on 06/08/2007   Method used:   Telephoned to ...         RxID:   [id number] EPZICOM 600-300 MG  TABS (ABACAVIR SULFATE-LAMIVUDINE) Take 1 tablet by mouth once a day  #30 x 6   Entered by:   Paulo Fruit   Authorized by:   Yisroel Ramming MD   Signed by:   Paulo Fruit on 06/08/2007   Method used:   Telephoned to ...         RxID:   [id number] LEXIVA 700 MG  TABS (FOSAMPRENAVIR CALCIUM) Take 2 tablets by mouth twice a day  #120 x 6   Entered by:   Paulo Fruit   Authorized by:   Yisroel Ramming MD   Signed by:   Paulo Fruit on 06/08/2007   Method used:   Telephoned to ...         RxID:   [id number]  Telephoned to [facility] [phone].  Fax - [phone]  Informed pharmacist that patient will have copay assist cards for Epzicom and Lexiva.  Appended Document: New Regimen phoned in. 06-08-07 Should be lexiva 700mg  one tablet  bid  Appended Document: New Regimen phoned in. 06-08-07 Corrected directions for Lexiva 700mg  to 1 twice daily.  It was also corrected with her pharmacy--[facility].   Clinical Lists Changes  Medications: Changed medication from LEXIVA 700 MG  TABS (FOSAMPRENAVIR CALCIUM) Take 2 tablets by mouth twice a day to LEXIVA 700 MG  TABS (FOSAMPRENAVIR CALCIUM) Take 1 tablet by mouth two times a day

## 2010-03-21 NOTE — Progress Notes (Signed)
Summary: Please advise:  Help with meds. 01-27-07/mld  Phone Note Call from Patient   Caller: Patient Details for Reason: concern about medication cost Summary of Call: Patient called wanting to know of any other avenues of her obtaining medication eventhough she has insurance.  She is over the [employer] for two in the home plus she has insurance for [employer].  She also does not qualify for any patient assistance programs because of the insurance.  I suggested that she may want to try to apply to [facility] mail order pharmacy because they work with those in the loophole with insurances.  In the meantime, patient has not been able to afford her medication and therefore has been off of meds for about a week.  She wanted to know if her doctor can switch her to another medication that is less costly.  She was once a patient of [facility] and have always taking Kaletra and [place].  I do not have sample of Truvada to help her out. Initial call taken by: Paulo Fruit,  January 27, 2007 2:18 PM  Follow-up for Phone Call        all HIV meds are expensive Follow-up by: Yisroel Ramming MD,  January 27, 2007 3:22 PM  Additional Follow-up for Phone Call Additional follow up Details #1::        I will call patient to let her know that there is no other resources at this time and that she should try [facility] mail order pharmacy because they work with people who have insurance in her situation.  Thanks Additional Follow-up by: Paulo Fruit,  January 28, 2007 7:59 AM

## 2010-03-21 NOTE — Assessment & Plan Note (Signed)
Summary: requesting med refills/tkk   CC:  pt. needing refills, off meds for one year, and had labs done at [facility].  History of Present Illness: patient here to reestablish.  She's been off her HIV medications for approximately 1 year.  She had an esophageal stricture and was unable to swallow her pills.  She had a dilation procedure and now able to swallow pills.  She also developed he candida esophagitis.  That has resolved.  She had a CD4 count viral load and genotype done while she was at [facility] however we do not have those results.  Preventive Screening-Counseling & Management  Alcohol-Tobacco     Alcohol drinks/day: 0     Smoking Status: never  Caffeine-Diet-Exercise     Caffeine use/day: coffee and tea     Does Patient Exercise: yes     Type of exercise: walking     Exercise (avg: min/session): 30-60     Times/week: 3  Safety-Violence-Falls     Seat Belt Use: yes      Sexual History:  n/a.    Comments: pt. declined condoms   Updated Prior Medication List: COMBIVIR 150-300 MG TABS (LAMIVUDINE-ZIDOVUDINE) Take 1 tablet by mouth two times a day KALETRA 200-50 MG TABS (LOPINAVIR-RITONAVIR) Take 2 tablets by mouth two times a day  Current Allergies (reviewed today): No known allergies  Past History:  Past Medical History: Last updated: 11/19/2005 HIV disease  Review of Systems  The patient denies fever, chest pain, and abdominal pain.    Vital Signs:  Patient profile:   40 year old female Menstrual status:  regular Height:      67 inches (170.18 cm) Weight:      217.8 pounds (99 kg) BMI:     34.24 Temp:     98.1 degrees F (36.72 degrees C) oral Pulse rate:   73 / minute BP sitting:   142 / 92  (left arm)  Vitals Entered By: Wendall Mola CMA Duncan Dull) (January 09, 2010 3:49 PM) CC: pt. needing refills, off meds for one year, had labs done at [facility] Is Patient Diabetic? No Pain Assessment Patient in pain? no      Nutritional Status BMI of >  30 = obese Nutritional Status Detail appetite "fine"  Have you ever been in a relationship where you felt threatened, hurt or afraid?No   Does patient need assistance? Functional Status Self care Ambulation Normal   Physical Exam  General:  alert, well-developed, well-nourished, and well-hydrated.   Head:  normocephalic and atraumatic.   Mouth:  pharynx pink and moist.  no thrush  Lungs:  normal breath sounds.      Impression & Recommendations:  Problem # 1:  HIV DISEASE (ICD-042) I will have patient sign a release of information so that we can obtain the CD4 count viral load and genotype from [facility].  I will start her back on her Combivir and Kaletra and repeat labs in 6 weeks pending the results of the genotype. Diagnostics Reviewed:  CD4: 390 (06/15/2009)   WBC: 4.1 (06/14/2009)   Hgb: 10.3 (06/14/2009)   HCT: 31.0 (06/14/2009)   Platelets: 275 (06/14/2009) HIV-1 RNA: 8790 (06/14/2009)   HBSAg: No (04/13/2006)  Other Orders: Est. Patient Level III (09811) Future Orders: T-CD4SP ([facility]) (CD4SP) ... 02/20/2010 T-HIV Viral Load [phone]) ... 02/20/2010 T-Comprehensive Metabolic Panel [phone]) ... 02/20/2010 T-CBC w/Diff ([id number]) ... 02/20/2010 T-RPR (Syphilis) [phone]) ... 02/20/2010 T-Lipid Profile [phone]) ... 02/20/2010  Patient Instructions: 1)  Please schedule a  follow-up appointment in 8 week, 2 weeks after labs.  Prescriptions: KALETRA 200-50 MG TABS (LOPINAVIR-RITONAVIR) Take 2 tablets by mouth two times a day  #120 x 5   Entered and Authorized by:   Yisroel Ramming MD   Signed by:   Yisroel Ramming MD on 01/09/2010   Method used:   Print then Give to Patient   RxID:   [id number] COMBIVIR 150-300 MG TABS (LAMIVUDINE-ZIDOVUDINE) Take 1 tablet by mouth two times a day  #60 x 5   Entered and Authorized by:   Yisroel Ramming MD   Signed by:   Yisroel Ramming MD on 01/09/2010   Method used:   Print then Give to Patient   RxID:    [id number]  Meds Faxed to [facility] at [phone] Wendall Mola CMA Duncan Dull)  January 09, 2010 4:48 PM   Immunization History:  Influenza Immunization History:    Influenza:  historical (12/05/2009)

## 2010-03-21 NOTE — Progress Notes (Signed)
Summary: ID Visit Notes 1/28/008  ID Visit Notes 1/28/008   Imported By: Florinda Marker 10/19/2009 15:57:17  _____________________________________________________________________  External Attachment:    Type:   Image     Comment:   External Document

## 2010-03-21 NOTE — Progress Notes (Signed)
Summary: Pt calling for medicaton refill and copay assist cards  Phone Note Call from Patient   Summary of Call: Pt calling to request refill of HIV meds.  She has not been here to see Dr Philipp Deputy since 5-11.  She was" trying to reach [name] so she could just get the medication without a visit."  Labs were done at [facility] while she was an inpatient and she does not see why she would need  OV.  Pt informed I am not authorized to refill her medications w/o recent OV.    Appt scheduled for Jan 09, 2010.    She was advised no meds w/o OV Tomasita Morrow RN  January 04, 2010 3:45 PM

## 2010-03-21 NOTE — Miscellaneous (Signed)
Summary: Juanell Fairly Update, 01/24/2009 PAP smear, Normal results  Clinical Lists Changes  Observations: Added new observation of PAP SMEAR: Normal (01/24/2009 11:57) Added new observation of LAST PAP DAT: 01/24/2009 (01/24/2009 11:57)

## 2010-03-21 NOTE — Assessment & Plan Note (Signed)
Summary: CHECKUP/ SB.   Chief Complaint:  f/u.  History of Present Illness: Pt here for f/u.  She has been trying to lose weight.  She is struggling with her wieght.  She states her BP has been up and down.  Current Allergies: No known allergies     Risk Factors:  Tobacco use:  never  PAP Smear History:    Date of Last PAP Smear:  04/13/2006   Review of Systems  The patient denies anorexia, fever, and chest pain.     Vital Signs:  Patient Profile:   40 Years Old Female Height:     66 inches Weight:      409.38 pounds Temp:     97.9 degrees F oral Pulse rate:   90 / minute BP sitting:   177 / 109  (left arm)  Pt. in pain?   no              Is Patient Diabetic? No Nutritional Status BMI of > 30 = obese Domestic Violence Intervention none  Does patient need assistance? Functional Status Self care Ambulation Normal     Physical Exam  General:     alert, well-developed, well-nourished, and well-hydrated.   Head:     normocephalic and atraumatic.   Mouth:     pharynx pink and moist.  no thrush  Lungs:     normal breath sounds.   Heart:     normal rate, regular rhythm, and no murmur.      Impression & Recommendations:  Problem # 1:  ESSENTIAL HYPERTENSION, BENIGN (ICD-401.1) She will monitor her BP at home. Her updated medication list for this problem includes:    Hydrochlorothiazide 25 Mg Tabs (Hydrochlorothiazide) .Marland Kitchen... Take 1 tablet by mouth once a day   Problem # 2:  HIV DISEASE (ICD-042) Pt.s most recent CD4ct was 840 and VL 326 .  Pt instructed to continue the current antiretroviral regimen.  Pt encouraged to take medication regularly and not miss doeses.  Pt will f/u in 3 months for repeat blood work and will see me 2 weeks later. She was given an influenza vaccine and will be schedule for a PAP.  Her updated medication list for this problem includes:    Truvada 200-300 Mg Tabs (Emtricitabine-tenofovir) ..... One pill a day    Kaletra  200-50 Mg Tabs (Lopinavir-ritonavir) .Marland Kitchen..Marland Kitchen Two twice a day  Orders: Est. Patient Level III (29518)  Future Orders: T-CBC w/Diff ([id number]) ... 04/19/2007 T-CD4 ([id number]) ... 04/19/2007 T-Comprehensive Metabolic Panel [phone]) ... 04/19/2007 T-HIV Viral Load [phone]) ... 04/19/2007   Medications Added to Medication List This Visit: 1)  Celexa 20 Mg Tabs (Citalopram hydrobromide) .... Take 1 tablet by mouth once a day 2)  Hydrochlorothiazide 25 Mg Tabs (Hydrochlorothiazide) .... Take 1 tablet by mouth once a day  Other Orders: T-Hgb A1C (in-house) ([id number])   Patient Instructions: 1)  Please schedule a follow-up appointment in 3 months, 2 weeks after labs on a Friday and schedule PAP same day in PAP clinic.    Prescriptions: HYDROCHLOROTHIAZIDE 25 MG  TABS (HYDROCHLOROTHIAZIDE) Take 1 tablet by mouth once a day  #30 x 5   Entered and Authorized by:   Yisroel Ramming MD   Signed by:   Yisroel Ramming MD on 01/19/2007   Method used:   Print then Give to Patient   RxID:   [id number] CELEXA 20 MG  TABS (CITALOPRAM HYDROBROMIDE) Take 1 tablet by mouth once a day  #30 x 5  Entered and Authorized by:   Yisroel Ramming MD   Signed by:   Yisroel Ramming MD on 01/19/2007   Method used:   Print then Give to Patient   RxID:   [id number] KALETRA 200-50 MG TABS (LOPINAVIR-RITONAVIR) Two twice a day  #120 x 5   Entered and Authorized by:   Yisroel Ramming MD   Signed by:   Yisroel Ramming MD on 01/19/2007   Method used:   Print then Give to Patient   RxID:   [id number] TRUVADA 200-300 MG TABS (EMTRICITABINE-TENOFOVIR) One pill a day  #30 x 5   Entered and Authorized by:   Yisroel Ramming MD   Signed by:   Yisroel Ramming MD on 01/19/2007   Method used:   Print then Give to Patient   RxID:   [id number]  ]  Influenza Immunization History:    Influenza # 1:  Fluvax Non-MCR (11/18/2006)   Appended Document: results of HGC A1C    Lab Visit   Laboratory  Results   Blood Tests   Date/Time Received: January 19, 2007 4:07 PM  Date/Time Reported: ..................................................................Marland KitchenOren Beckmann  January 19, 2007 4:07 PM   HGBA1C: 5.9%   (Normal Range: Non-Diabetic - 3-6%   Control Diabetic - 6-8%)     Orders Today:

## 2010-03-21 NOTE — Progress Notes (Signed)
Summary: release of info. to [facility]  Phone Note Outgoing Call   Call placed by: Annice Pih Summary of Call: Faxed release of info. to [facility] for pts. labs Initial call taken by: Wendall Mola CMA Duncan Dull),  January 14, 2010 4:35 PM

## 2010-03-21 NOTE — Letter (Signed)
Summary: [facility] Referral  [facility] Referral   Imported By: Florinda Marker 06/05/2008 15:17:35  _____________________________________________________________________  External Attachment:    Type:   Image     Comment:   External Document

## 2010-03-21 NOTE — Progress Notes (Signed)
Summary: Phoned patient 03-03-07/mld  Phone Note Outgoing Call   Call placed by: Paulo Fruit,  March 03, 2007 1:54 PM Action Taken: Information Sent Summary of Call: Patient wanted the clinic to call her to let her know when her medication was faxed [facility] [phone].) Left message for patient to contact me by 5pm at [phone] Initial call taken by: Paulo Fruit,  March 03, 2007 1:54 PM

## 2010-03-21 NOTE — Consult Note (Signed)
Summary: [facility]: GI  [facility]: GI   Imported By: Florinda Marker 04/05/2009 08:44:43  _____________________________________________________________________  External Attachment:    Type:   Image     Comment:   External Document

## 2010-03-21 NOTE — Assessment & Plan Note (Signed)
Summary: EST-CK/FU/MEDS/CFB   CC:  follow-up visit.  History of Present Illness: Pt here for lab results. No missed doses of her HIV meds.  She is interested in bariatric surgery.  she has been unable to lose weight on her own. She wants to know if it would be safe for her to have surgery.  Preventive Screening-Counseling & Management     Alcohol drinks/day: 0     Smoking Status: never     Caffeine use/day: coffee and tea     Does Patient Exercise: yes     Type of exercise: walking     Exercise (avg: min/session): 30-60     Times/week: 3     Seat Belt Use: yes   Updated Prior Medication List: CYMBALTA 20 MG CPEP (DULOXETINE HCL) 1 qd BENICAR HCT 40-12.5 MG TABS (OLMESARTAN MEDOXOMIL-HCTZ) 1 qd LEXIVA 700 MG  TABS (FOSAMPRENAVIR CALCIUM) Take 1 tablet by mouth two times a day EPZICOM 600-300 MG  TABS (ABACAVIR SULFATE-LAMIVUDINE) Take 1 tablet by mouth once a day NORVIR 100 MG  CAPS (RITONAVIR) Take 1 capsule by mouth two times a day  Current Allergies (reviewed today): No known allergies  Past History:  Past Medical History:    HIV disease     (11/19/2005)  Review of Systems  The patient denies anorexia, fever, and weight loss.    Vital Signs:  Patient profile:   40 year old female Height:      67 inches (170.18 cm) Weight:      430.5 pounds (195.68 kg) BMI:     67.67 Temp:     96.7 degrees F (35.94 degrees C) oral Pulse rate:   84 / minute BP sitting:   139 / 91  (left arm)  Vitals Entered By: Baxter Hire) (June 09, 2008 2:56 PM) CC: follow-up visit Is Patient Diabetic? No Pain Assessment Patient in pain? no      Nutritional Status BMI of > 30 = obese Nutritional Status Detail okay  Does patient need assistance? Functional Status Self care Ambulation Normal   Physical Exam  General:  alert, well-hydrated, and overweight-appearing.   Head:  normocephalic and atraumatic.   Mouth:  pharynx pink and moist.  no thrush  Lungs:  normal breath  sounds.      Impression & Recommendations:  Problem # 1:  HIV DISEASE (ICD-042) Pt.s most recent CD4ct was 550 and VL <48 .  Pt instructed to continue the current antiretroviral regimen.  Pt encouraged to take medication regularly and not miss doses.  Pt will f/u in 3 months for repeat blood work and will see me 2 weeks later.  Diagnostics Reviewed:  CD4: 550 (05/26/2008)   WBC: 7.4 (05/26/2008)   Hgb: 11.4 (05/26/2008)   HCT: 35.5 (05/26/2008)   Platelets: 332 (05/26/2008) HIV-1 RNA: <48 copies/mL (05/26/2008)   HBSAg: No (04/13/2006)  Problem # 2:  OBESITY, UNSPECIFIED (ICD-278.00) With Pt's current CD4ct ther is no reason from an ID standpoint that she could not have surgery.  Problem # 3:  ESSENTIAL HYPERTENSION, BENIGN (ICD-401.1) slightly elevated - pt to f/u with her PCP. Her updated medication list for this problem includes:    Benicar Hct 40-12.5 Mg Tabs (Olmesartan medoxomil-hctz) .Marland Kitchen... 1 qd  Other Orders: Est. Patient Level III (04540) Future Orders: T-CD4 ([id number]) ... 09/07/2008 T-HIV Viral Load [phone]) ... 09/07/2008 T-Comprehensive Metabolic Panel [phone]) ... 09/07/2008 T-CBC w/Diff ([id number]) ... 09/07/2008  Patient Instructions: 1)  Please schedule a follow-up appointment in 3 months, 2 weeks  after labs.

## 2010-03-21 NOTE — Assessment & Plan Note (Signed)
Summary: pap smear and lab visit, H1N1 Vaccine   Vital Signs:  Patient Profile:   40 Years Old Female CC:      PAP smear visit LMP:     05/09/2008  Vitals Entered By: Jennet Maduro RN (May 26, 2008 10:41 AM)  Menstrual History: LMP (date): 05/09/2008               Last PAP Date 05/26/2008  Pt. given educational materials re:  HIV and exercise, diet/nutrition, PAP smears and women's health.  Pt. given weekly, twice a day pillbox.  Pt. given condoms.  Jennet Maduro RN  May 26, 2008 11:25 AM   Medical History  Evaluation and Follow-Up  Prevention For Positives: 05/26/2008   Safe sex practices discussed with patient. Condoms offered.   Prior Medications: CYMBALTA 20 MG CPEP (DULOXETINE HCL) 1 qd BENICAR HCT 40-12.5 MG TABS (OLMESARTAN MEDOXOMIL-HCTZ) 1 qd LEXIVA 700 MG  TABS (FOSAMPRENAVIR CALCIUM) Take 1 tablet by mouth two times a day EPZICOM 600-300 MG  TABS (ABACAVIR SULFATE-LAMIVUDINE) Take 1 tablet by mouth once a day NORVIR 100 MG  CAPS (RITONAVIR) Take 1 capsule by mouth two times a day Current Allergies: No known allergies   Immunizations Administered:  H1N1 # 1:    Vaccine Type: Influenza virus vaccine- pandemic formulation (H1N1)    Site: right deltoid    Dose: 0.5 ml    Route: IM    Given by: Jennet Maduro RN    Exp. Date: 06/13/2009    Lot #: [id number]  Flu Vaccine Consent Questions:    Do you have a history of severe allergic reactions to this vaccine? no    Any prior history of allergic reactions to egg and/or gelatin? no    Do you have a sensitivity to the preservative Thimersol? no    Do you have a past history of Guillan-Barre Syndrome? no    Do you currently have an acute febrile illness? no    Have you ever had a severe reaction to latex? no    Vaccine information given and explained to patient? yes    Are you currently pregnant? no   Orders Added: 1)  Est. Patient Level I [40102] 2)  T-Pap Smear [88150] 3)  Influenza virus  vaccine- pandemic formulation (H1N1) [72536] 4)  Influenza A (H1N1) adm  fee Medicare/Non Medicare Shadi.Imam    ]          Prevention For Positives: 05/26/2008   Safe sex practices discussed with patient. Condoms offered.

## 2010-03-21 NOTE — Assessment & Plan Note (Signed)
Summary: FU OV/VS   Chief Complaint:  f/u.  History of Present Illness: Pt here to get her lab results.  She has been off meds due to financial issues.  There is a Museum/gallery conservator that will pay most of the costs for Lexiva and Epzicom. She would have to pay for the full cost of norvir that goes with it.  She would like to try that.  I will obtain a HLA B5107 today and if it is negative start her on the Epzicom and  boosted Lexiva.    Current Allergies: No known allergies     Risk Factors: Tobacco use:  never  PAP Smear History:    Date of Last PAP Smear:  05/07/2007   Review of Systems  The patient denies anorexia, fever, and weight loss.     Vital Signs:  Patient Profile:   40 Years Old Female Height:     66 inches (167.64 cm) Weight:      403.50 pounds (183.41 kg) BMI:     65.36 Temp:     97.1 degrees F (36.17 degrees C) oral Pulse rate:   68 / minute BP sitting:   146 / 97  (left arm)  Pt. in pain?   no  Vitals Entered By: Starleen Arms CMA (May 21, 2007 9:52 AM)              Is Patient Diabetic? No Nutritional Status BMI of > 30 = obese  Does patient need assistance? Functional Status Self care Ambulation Normal Comments none     Physical Exam  General:     alert, well-developed, and well-nourished.   Head:     normocephalic and atraumatic.      Impression & Recommendations:  Problem # 1:  HIV DISEASE (ICD-042) We will obtain a HLA B5107 today and if negative start her on Epzicome, norvir and Lexiva.  She will call for the results in about a week and we will start therapy after that.  She will return in about 4 weeks after starting her new regimen. The following medications were removed from the medication list:    Truvada 200-300 Mg Tabs (Emtricitabine-tenofovir) ..... One pill a day    Kaletra 200-50 Mg Tabs (Lopinavir-ritonavir) .Marland Kitchen..Marland Kitchen Two twice a day  Orders: T- * Misc. Laboratory test [phone]) Est. Patient Level III  (95621)  Future Orders: T-CBC w/Diff ([id number]) ... 07/05/2007 T-CD4 ([id number]) ... 07/05/2007 T-Comprehensive Metabolic Panel [phone]) ... 07/05/2007 T-HIV Viral Load [id number]) ... 07/05/2007    Patient Instructions: 1)  Please schedule a follow-up appointment in 8 weeks.    ]

## 2010-03-21 NOTE — Consult Note (Signed)
Summary: [facility]   Imported By: Florinda Marker 11/05/2009 16:45:26  _____________________________________________________________________  External Attachment:    Type:   Image     Comment:   External Document

## 2010-03-21 NOTE — Miscellaneous (Signed)
Summary: [facility]   Imported By: Florinda Marker 10/19/2009 15:58:29  _____________________________________________________________________  External Attachment:    Type:   Image     Comment:   External Document

## 2010-03-21 NOTE — Miscellaneous (Signed)
Summary: clinical update/ryan white  Clinical Lists Changes  Observations: Added new observation of PCTFPL: 236.54  (09/22/2008 10:15) Added new observation of HOUSEINCOME: 14782  (09/22/2008 10:15) Added new observation of FINASSESSDT: 09/22/2008  (09/22/2008 10:15) Added new observation of YEARLYEXPEN: 1663  (09/22/2008 10:15) Added new observation of RW VITAL STA: Active  (09/22/2008 10:15)

## 2010-03-21 NOTE — Miscellaneous (Signed)
Summary: [facility]   Imported By: Florinda Marker 01/16/2010 09:35:16  _____________________________________________________________________  External Attachment:    Type:   Image     Comment:   External Document

## 2010-03-21 NOTE — Letter (Signed)
Summary: Results Follow-up Letter  [facility]  [address]   [place], [place] [address]   Phone: [phone]  Fax: [phone]          June 06, 2008  [address] [place], [place]  [address]  Dear Ms. Dorer,   The following are the results of your recent test(s):  Test     Result     Pap Smear     Sample inadequate       Comments:  As we disucussed the PAP sample that I obtained was inadequate.  Your appointment at [facility] is:      [facility]     [address]    Wednesday, September 06, 2008 @ 12:45 PM   If this appointment needs to be rescheduled please do so at least 24 hours in advance.  Please call [phone].  Thank you.  Sincerely,    Jennet Maduro RN [facility]

## 2010-03-21 NOTE — Progress Notes (Signed)
Summary: Pt. restarting mads after off for 2 months  Phone Note Call from Patient Call back at Home Phone [phone]   Caller: Patient Reason for Call: Talk to Nurse Summary of Call: Had difficulty getting HIV medications for 2 months.  Was off these rxes for 2 months.  Has straightened out the insurance difficulties and has now gotten both HIV rxes filled and will be able to continue with refills.  Wanted to know whether to restart both medications.  RN advised that restarting both meds at this time is OK since she was off both of them for the 2 months.  Return visit for labs 04/19/07 and w/ Dr. Philipp Deputy 05/07/07.  Pt. verbalized understanding of restarting meds. Initial call taken by: Jennet Maduro RN,  March 19, 2007 2:18 PM

## 2010-03-21 NOTE — Letter (Signed)
Summary: ID Flow Sheet:02/23/06  ID Flow Sheet:02/23/06   Imported By: Harrison Mons Steps 10/16/2009 16:16:37  _____________________________________________________________________  External Attachment:    Type:   Image     Comment:   External Document

## 2010-03-21 NOTE — Progress Notes (Signed)
----   Converted from flag ---- ---- 05/07/2007 10:01 AM, Yisroel Ramming MD wrote: The following orders have been entered for this patient and placed on Admin Hold:  Type:     Referral       Code:   Social  Description:   Social Work Referral Order Date:   05/07/2007   Authorized By:   Yisroel Ramming MD Order #:   [id number] Clinical Notes:   Pt unable to obtain meds due to insurnace costs   ------------------------------

## 2010-03-21 NOTE — Letter (Signed)
Summary: Results Follow-up Letter  [facility]  [address]   [place], [place] [address]   Phone: [phone]  Fax: [phone]          May 17, 2007   [address] [place], [place]  [address]  Dear Ms. Mcenaney,   The following are the results of your recent test(s):  Test     Result     Pap Smear    Normal__XXX___  Not Normal_____         Comments:   Thank you for allowing me to provide this service.  I will see you        in one year.   Sincerely,    Jennet Maduro RN [facility]

## 2010-03-21 NOTE — Miscellaneous (Signed)
Summary: Will complete PAP smear on next visit w/ MD 05/07/07  Clinical Lists Changes Will need PAP on next visit w/ MD on 05/07/07. ..................................................................Marland KitchenJennet Maduro RN  April 29, 2007 4:28 PM

## 2010-03-21 NOTE — Miscellaneous (Signed)
Summary: 01/24/2009 PAP smear - Negative  Clinical Lists Changes  Observations: Added new observation of PAP SMEAR: Negative (01/24/2009 13:51) Added new observation of LAST PAP DAT: 01/24/2009 (01/24/2009 13:51)

## 2010-03-21 NOTE — Assessment & Plan Note (Signed)
Summary: CHECKUP/ SB.   Chief Complaint:  f/u  knee pain and questions about vaginitis.  History of Present Illness: Pt has been taking her meds regularly.  She is concerned about Eustace Pen because that is when her co-pays hit her hard.  She saw her PCP and her BP medication was adjusted.  She c/o vaginal itching and discomfort that occurs in the middle of her menstrual cycle.  She has tried OTC creams without relief. She denies discharge.    Updated Prior Medication List: CYMBALTA 20 MG CPEP (DULOXETINE HCL) 1 qd BENICAR HCT 40-12.5 MG TABS (OLMESARTAN MEDOXOMIL-HCTZ) 1 qd LEXIVA 700 MG  TABS (FOSAMPRENAVIR CALCIUM) Take 1 tablet by mouth two times a day EPZICOM 600-300 MG  TABS (ABACAVIR SULFATE-LAMIVUDINE) Take 1 tablet by mouth once a day NORVIR 100 MG  CAPS (RITONAVIR) Take 1 capsule by mouth two times a day  Current Allergies: No known allergies     Risk Factors: Tobacco use:  never  PAP Smear History:    Date of Last PAP Smear:  05/07/2007   Review of Systems       The patient complains of weight gain.  The patient denies anorexia, fever, and weight loss.     Vital Signs:  Patient Profile:   40 Years Old Female Height:     66 inches (167.64 cm) Weight:      416 pounds (189.09 kg) BMI:     67.39 Temp:     97.7 degrees F (36.50 degrees C) oral Pulse rate:   85 / minute BP sitting:   156 / 96  (left arm)  Pt. in pain?   no  Vitals Entered By: Starleen Arms CMA (January 04, 2008 3:06 PM)              Is Patient Diabetic? No Nutritional Status BMI of > 30 = obese Nutritional Status Detail nl  Have you ever been in a relationship where you felt threatened, hurt or afraid?No   Does patient need assistance? Functional Status Self care Ambulation Normal     Physical Exam  General:     alert, well-hydrated, and overweight-appearing.   Head:     normocephalic and atraumatic.   Mouth:     pharynx pink and moist.   Lungs:     normal breath sounds.    Heart:     normal rate, regular rhythm, and no murmur.      Impression & Recommendations:  Problem # 1:  HIV DISEASE (ICD-042) Pt.s most recent CD4ct was 500 and VL 410  .  Pt instructed to continue the current antiretroviral regimen.  Pt encouraged to take medication regularly and not miss doses.  Pt will f/u in 3 months for repeat blood work and will see me 2 weeks later. I will have her talk to [name] to see if she can get some help with her co-pays.  Her updated medication list for this problem includes:    Lexiva 700 Mg Tabs (Fosamprenavir calcium) .Marland Kitchen... Take 1 tablet by mouth two times a day    Epzicom 600-300 Mg Tabs (Abacavir sulfate-lamivudine) .Marland Kitchen... Take 1 tablet by mouth once a day    Norvir 100 Mg Caps (Ritonavir) .Marland Kitchen... Take 1 capsule by mouth two times a day  Diagnostics Reviewed:  CD4: 500 (12/17/2007)   Hgb: 11.4 (12/16/2007)   HCT: 36.7 (12/16/2007)   HIV-1 RNA: 410 (12/16/2007)   HBSAg: No (04/13/2006)   Problem # 2:  PRURITUS, VAGINAL (ICD-698.1)  Orders: Gynecologic  Referral (Gyn)   Problem # 3:  HYPERLIPIDEMIA (ICD-272.4) discussed diet and exercise. Pt to discuss with PCP.  Problem # 4:  ESSENTIAL HYPERTENSION, BENIGN (ICD-401.1) BP still elevated - needs to discuss with PCP.   Her updated medication list for this problem includes:    Benicar Hct 40-12.5 Mg Tabs (Olmesartan medoxomil-hctz) .Marland Kitchen... 1 qd  BP today: 156/96 Prior BP: 146/97 (05/21/2007)  Labs Reviewed: Creat: 0.81 (12/16/2007) Chol: 220 (12/16/2007)   HDL: 40 (12/16/2007)   LDL: 151 (12/16/2007)   TG: 146 (12/16/2007)   Medications Added to Medication List This Visit: 1)  Cymbalta 20 Mg Cpep (Duloxetine hcl) .Marland Kitchen.. 1 qd 2)  Benicar Hct 40-12.5 Mg Tabs (Olmesartan medoxomil-hctz) .Marland Kitchen.. 1 qd  Other Orders: Est. Patient Level III ([address])  Future Orders: T-CD4 ([id number]) ... 04/03/2008 T-HIV Viral Load [id number]) ... 04/03/2008 T-Comprehensive Metabolic Panel [phone]) ...  04/03/2008 T-CBC w/Diff ([id number]) ... 04/03/2008     Patient Instructions: 1)  Please schedule a follow-up appointment in 3 months, 2 weeks after labs.   ]  Influenza Immunization History:    Influenza # 1:  Historical (11/18/2007)   Appended Document: CHECKUP/ SB. Patient came into discuss prescription options for January.  Patient was told that there are no programs available to assist with insurance patients.  I did however inform her that I will assist her for 2 months if she becomes in a bind.  She appreciated the help.

## 2010-03-21 NOTE — Assessment & Plan Note (Signed)
Summary: FU OV/PAP SMEAR/PER DR Alline Pio/VS   Chief Complaint:  f/u  fatigue and no meds this month.  History of Present Illness: Pt here for f/u.  Pt has been unable to get her meds becuase of the high costs of her insurance deductable and co-pays.  She is very stressed about not being on meds.  She has called [facility] and they are unable to help her.  She tried [facility] but her insurance would not let her use that program.  She c/o fatigue and insomnia.    Current Allergies (reviewed today): No known allergies     Risk Factors: Tobacco use:  never  PAP Smear History:    Date of Last PAP Smear:  04/13/2006   Review of Systems  The patient denies anorexia, fever, and weight loss.     Vital Signs:  Patient Profile:   40 Years Old Female LMP:     04/09/2007 Height:     66 inches (167.64 cm) Weight:      410.19 pounds (186.45 kg) BMI:     66.45 Temp:     97.4 degrees F (36.33 degrees C) oral Pulse rate:   83 / minute BP sitting:   151 / 98  (left arm)  Pt. in pain?   no  Vitals Entered By: Starleen Arms (May 07, 2007 9:37 AM)  Menstrual History: LMP (date): 04/09/2007 LMP - Character: normal              Is Patient Diabetic? No Nutritional Status BMI of > 30 = obese  Does patient need assistance? Functional Status Self care Ambulation Normal Comments missed meds this month due to higher deductible new job   Last PAP Date 04/09/2007   Physical Exam  General:     alert, well-developed, well-nourished, and well-hydrated.   Head:     normocephalic and atraumatic.   Lungs:     normal breath sounds.   Heart:     normal rate, regular rhythm, and no murmur.      Impression & Recommendations:  Problem # 1:  HIV DISEASE (ICD-042) Currently off meds. We will obtain labs today and refer her to Social work to see if they have nay resources for her.  Unfortunatley she is in the gap between good insurance coverage which would pay for her meds and no  insurance where she can get her meds through [facility].  She will f/u in 2 weeks. Her updated medication list for this problem includes:    Truvada 200-300 Mg Tabs (Emtricitabine-tenofovir) ..... One pill a day    Kaletra 200-50 Mg Tabs (Lopinavir-ritonavir) .Marland Kitchen..Marland Kitchen Two twice a day  Orders: Est. Patient Level III ([address]) Social Work Referral (Social ) T- * Misc. Laboratory test [phone])   Other Orders: T-Pap Smear ([address]41)   Patient Instructions: 1)  Please schedule a follow-up appointment in 2 weeks.    ]

## 2010-03-21 NOTE — Miscellaneous (Signed)
Summary: clinical update/ryan white  Clinical Lists Changes  Observations: Added new observation of PCTFPL: 253.95  (09/08/2007 9:52) Added new observation of HOUSEINCOME: [address]  (09/08/2007 9:52) Added new observation of FINASSESSDT: 09/08/2007  (09/08/2007 9:52) Added new observation of YEARLYEXPEN: 2400  (09/08/2007 9:52)

## 2010-03-21 NOTE — Miscellaneous (Signed)
  Clinical Lists Changes  Observations: Added new observation of YEARAIDSPOS: 1999  (01/15/2010 9:49) Added new observation of HIV STATUS: CDC-defined AIDS  (01/15/2010 9:49)

## 2010-03-21 NOTE — Assessment & Plan Note (Signed)
Summary: F/U [MKJ]   CC:  follow-up visit and lab results.  History of Present Illness: Pt had an esophageal stricture and was unable to take any pills so she has been off her HIV meds. She had a dilation in february but the stricture re-occured and she needs to have the procedure again.  She is also having trouble with the co-pays she has to pay.  She will talk to [facility] about some assistance.  Preventive Screening-Counseling & Management  Alcohol-Tobacco     Alcohol drinks/day: 0     Smoking Status: never  Caffeine-Diet-Exercise     Caffeine use/day: coffee and tea     Does Patient Exercise: yes     Type of exercise: walking     Exercise (avg: min/session): 30-60     Times/week: 3  Safety-Violence-Falls     Seat Belt Use: yes      Sexual History:  n/a.     Current Allergies (reviewed today): No known allergies  Past History:  Past Medical History: Last updated: 11/19/2005 HIV disease  Social History: Sexual History:  n/a  Review of Systems  The patient denies fever, chest pain, and headaches.    Vital Signs:  Patient profile:   40 year old female Menstrual status:  regular Height:      67 inches (170.18 cm) Weight:      276.8 pounds (125.82 kg) BMI:     43.51 Temp:     98.4 degrees F (36.89 degrees C) oral Pulse rate:   80 / minute BP sitting:   136 / 87  (right arm)  Vitals Entered By: Wendall Mola CMA Duncan Dull) (Jun 29, 2009 9:36 AM) CC: follow-up visit, lab results Is Patient Diabetic? No Pain Assessment Patient in pain? no      Nutritional Status BMI of > 30 = obese Nutritional Status Detail appetite "normal"  Have you ever been in a relationship where you felt threatened, hurt or afraid?No   Does patient need assistance? Functional Status Self care Ambulation Normal Comments pt. has been off all meds since November 2010   Physical Exam  General:  alert, well-developed, well-nourished, and well-hydrated.   Head:  normocephalic and  atraumatic.   Mouth:  pharynx pink and moist.   Lungs:  normal breath sounds.     Impression & Recommendations:  Problem # 1:  HIV DISEASE (ICD-042) CD4ct down to 390.  Pt will start HIV meds after next esophageal dilaltion and callfor lab appt after 6 weeks on meds. Orders: Est. Patient Level III (284[address])  Diagnostics Reviewed:  CD4: 390 (06/15/2009)   WBC: 4.1 (06/14/2009)   Hgb: 10.3 (06/14/2009)   HCT: 31.0 (06/14/2009)   Platelets: 275 (06/14/2009) HIV-1 RNA: 8790 (06/14/2009)   HBSAg: No (04/13/2006)  Problem # 2:  ESOPHAGEAL STRICTURE (ICD-530.3) scheduled for dilation.  Medications Added to Medication List This Visit: 1)  Kaletra 200-50 Mg Tabs (Lopinavir-ritonavir) .... Take 2 tablets by mouth two times a day  Patient Instructions: 1)  call for lab appt 6 weeks after starting meds  Prescriptions: KALETRA 200-50 MG TABS (LOPINAVIR-RITONAVIR) Take 2 tablets by mouth two times a day  #120 x 5   Entered and Authorized by:   Yisroel Ramming MD   Signed by:   Yisroel Ramming MD on 06/29/2009   Method used:   Print then Give to Patient   RxID:   [id number] COMBIVIR 150-300 MG TABS (LAMIVUDINE-ZIDOVUDINE) Take 1 tablet by mouth two times a day  #60 x 5  Entered and Authorized by:   Yisroel Ramming MD   Signed by:   Yisroel Ramming MD on 06/29/2009   Method used:   Print then Give to Patient   RxID:   [id number]

## 2010-03-21 NOTE — Progress Notes (Signed)
----   Converted from flag ---- ---- 06/01/2008 3:35 PM, Yisroel Ramming MD wrote: yes  ---- 06/01/2008 3:13 PM, Jennet Maduro RN wrote: Dr. Philipp Deputy,   I was unable to obtain a specimen from the cervix due to the pt's weight.  Is it OK to referr to [facility]?  Thanks,   Angelique Blonder ------------------------------

## 2010-03-21 NOTE — Consult Note (Signed)
Summary: [facility]  [facility]   Imported By: Florinda Marker 05/03/2008 15:13:48  _____________________________________________________________________  External Attachment:    Type:   Image     Comment:   External Document

## 2010-03-21 NOTE — Miscellaneous (Signed)
Summary: Orders Update - labs  Clinical Lists Changes  Orders: Added new Test order of T-Hgb A1C (in-house) [phone]) - Signed Added new Test order of T-CBC w/Diff [phone]) - Signed Added new Test order of T-CD4 [phone]) - Signed Added new Test order of T-Comprehensive Metabolic Panel [id number]) - Signed Added new Test order of T-HIV Viral Load [phone]) - Signed Added new Test order of T-Lipid Profile ([id number]) - Signed Added new Test order of T-RPR (Syphilis) ([id number]) - Signed

## 2010-03-21 NOTE — Miscellaneous (Signed)
Summary: HIPAA Restrictions  HIPAA Restrictions   Imported By: Florinda Marker 01/05/2008 14:57:34  _____________________________________________________________________  External Attachment:    Type:   Image     Comment:   External Document

## 2010-03-21 NOTE — Progress Notes (Signed)
Summary: f/u lab and pap smear appt.  Phone Note Outgoing Call   Call placed by: Jennet Maduro RN,  May 17, 2008 10:33 AM Call placed to: Patient Action Taken: Appt scheduled Summary of Call: Attempted to reach pt. by phone.  No answer.  Pt. needed to schedule a lab appointment prior to seeing Dr. Philipp Deputy and needed a PAP smear.  RN made appointments for Monday, April 12 and sent pt. a letter with the appointment information. Jennet Maduro RN  May 17, 2008 10:34 AM

## 2010-03-21 NOTE — Consult Note (Signed)
Summary: [facility]: New PT. Eval, Wt. loss surgery  [facility]: New PT. Eval   Imported By: Florinda Marker 08/07/2008 15:11:56  _____________________________________________________________________  External Attachment:    Type:   Image     Comment:   External Document

## 2010-03-21 NOTE — Letter (Signed)
Summary: Generic Letter  [facility]  [address]   [place], [place] [address]   Phone: [phone]  Fax: [phone]          May 17, 2008  [facility] [address] [place], [place]  [address]  Dear Ms. Avis,  I attempted to call you to arrange a Lab appointment prior to seeing Dr. Philipp Deputy on Friday, June 09, 2008 @ 2:30.  An appointment for Monday, May 29, 2008 @ 11:00 for your Lab appointment and a PAP smear appointment.  I look forward to seeing you then.  Thank you in advance for coming to these important appointments.  Sincerely,    Jennet Maduro RN [facility]

## 2010-03-21 NOTE — Progress Notes (Signed)
Summary: ID Visit Notes 01/03/98  ID Visit Notes 01/03/98   Imported By: Florinda Marker 10/19/2009 15:59:17  _____________________________________________________________________  External Attachment:    Type:   Image     Comment:   External Document

## 2010-03-21 NOTE — Assessment & Plan Note (Signed)
Summary: F/U/VS   CC:  3 month follow up.  History of Present Illness: Pt currently being evaluated for bariatric surgery.  She is working with a Health and safety inspector and is on a diet.  She did miss 2 days of her HIV meds because they were not delivered on time.  Preventive Screening-Counseling & Management  Alcohol-Tobacco     Alcohol drinks/day: 0     Smoking Status: never  Caffeine-Diet-Exercise     Caffeine use/day: coffee and tea     Does Patient Exercise: yes     Type of exercise: walking     Exercise (avg: min/session): 30-60     Times/week: 3  Safety-Violence-Falls     Seat Belt Use: yes   Updated Prior Medication List: CYMBALTA 20 MG CPEP (DULOXETINE HCL) 1 qd BENICAR HCT 40-12.5 MG TABS (OLMESARTAN MEDOXOMIL-HCTZ) 1 qd LEXIVA 700 MG  TABS (FOSAMPRENAVIR CALCIUM) Take 1 tablet by mouth two times a day EPZICOM 600-300 MG  TABS (ABACAVIR SULFATE-LAMIVUDINE) Take 1 tablet by mouth once a day NORVIR 100 MG  CAPS (RITONAVIR) Take 1 capsule by mouth two times a day  Current Allergies (reviewed today): No known allergies  Review of Systems  The patient denies anorexia, fever, abdominal pain, and severe indigestion/heartburn.    Vital Signs:  Patient profile:   40 year old female Height:      67 inches (170.18 cm) Weight:      423.3 pounds (192.41 kg) BMI:     66.54 Temp:     97.8 degrees F (36.56 degrees C) oral Pulse rate:   78 / minute BP sitting:   134 / 94  (left arm)  Vitals Entered By: Baxter Hire) (September 22, 2008 10:24 AM) CC: 3 month follow up Is Patient Diabetic? No Pain Assessment Patient in pain? no      Nutritional Status BMI of > 30 = obese Nutritional Status Detail appetite is okay per patient  Have you ever been in a relationship where you felt threatened, hurt or afraid?No   Does patient need assistance? Functional Status Self care Ambulation Normal   Physical Exam  General:  alert, well-hydrated, and overweight-appearing.     Head:  normocephalic and atraumatic.      Impression & Recommendations:  Problem # 1:  HIV DISEASE (ICD-042) Pt.s most recent CD4ct was 600 and VL 207 .  Pt instructed to continue the current antiretroviral regimen.  Pt encouraged to take medication regularly and not miss doses.  Pt will f/u in 3 months for repeat blood work and will see me 2 weeks later.  Pt will re-schedule PAP at [facility].  Diagnostics Reviewed:  CD4: 600 (09/07/2008)   WBC: 7.6 (09/06/2008)   Hgb: 11.7 (09/06/2008)   HCT: 36.2 (09/06/2008)   Platelets: 333 (09/06/2008) HIV-1 RNA: 107 (09/06/2008)   HBSAg: No (04/13/2006)  Problem # 2:  OBESITY, UNSPECIFIED (ICD-278.00) undergoing evaluation for bariatric surgery  Problem # 3:  ESSENTIAL HYPERTENSION, BENIGN (ICD-401.1) borderline control.  Her updated medication list for this problem includes:    Benicar Hct 40-12.5 Mg Tabs (Olmesartan medoxomil-hctz) .Marland Kitchen... 1 qd  Other Orders: Est. Patient Level III (03474) Future Orders: T-CD4SP ([facility]) (CD4SP) ... 12/21/2008 T-HIV Viral Load [id number]) ... 12/21/2008 T-Comprehensive Metabolic Panel [id number]) ... 12/21/2008 T-CBC w/Diff ([id number]) ... 12/21/2008  Patient Instructions: 1)  Please schedule a follow-up appointment in 3 months, 2 weeks after labs.

## 2010-03-21 NOTE — Assessment & Plan Note (Signed)
Summary: CHECKUP/SB.   CC:  f/u trouble swallowing medications and .  History of Present Illness: Pt's surgery went well.  She is having trouble swalloing her pills and has not been taking the consistantly.  They said that might be the case since intitially there might be narrowing due to the surgery.  She would like to get liquid medication if possible.  She thinks she can wallow the combivir and tolerated the Kaletra in the past. Her last genotype showed reaiatance to NNRTIs and Viracept as well as a 184 mutation.   Preventive Screening-Counseling & Management  Alcohol-Tobacco     Alcohol drinks/day: 0     Smoking Status: never  Caffeine-Diet-Exercise     Caffeine use/day: coffee and tea     Does Patient Exercise: yes     Type of exercise: walking     Exercise (avg: min/session): 30-60     Times/week: 3   Updated Prior Medication List: CYMBALTA 20 MG CPEP (DULOXETINE HCL) 1 qd BENICAR HCT 40-12.5 MG TABS (OLMESARTAN MEDOXOMIL-HCTZ) 1 qd COMBIVIR 150-300 MG TABS (LAMIVUDINE-ZIDOVUDINE) Take 1 tablet by mouth two times a day KALETRA 400-100 MG/5ML SOLN (LOPINAVIR-RITONAVIR) 5ml by mouth twice a day  Current Allergies (reviewed today): No known allergies  Additional History Menstrual Status:  regular  Review of Systems  The patient denies anorexia and fever.    Vital Signs:  Patient profile:   40 year old female Menstrual status:  regular LMP:     12/11/2008 Height:      67 inches (170.18 cm) Weight:      370 pounds (168.18 kg) BMI:     58.16 Temp:     97.7 degrees F (36.50 degrees C) oral Pulse rate:   91 / minute BP sitting:   134 / 94  (left arm)  Vitals Entered By: Starleen Arms CMA (January 09, 2009 4:07 PM) CC: f/u trouble swallowing medications,  Is Patient Diabetic? No Pain Assessment Patient in pain? no      Nutritional Status BMI of > 30 = obese Nutritional Status Detail gastric bypass surgery  Have you ever been in a relationship where you  felt threatened, hurt or afraid?No   Does patient need assistance? Functional Status Self care Ambulation Normal LMP (date): 12/11/2008 LMP - Character: normal     Menstrual Status regular Enter LMP: 12/11/2008 Last PAP Result NEGATIVE FOR INTRAEPITHELIAL LESIONS OR MALIGNANCY.   Physical Exam  General:  alert, well-hydrated, and overweight-appearing.   Head:  normocephalic and atraumatic.      Impression & Recommendations:  Problem # 1:  HIV DISEASE (ICD-042) Will obtain labs today and have her start on combivir and liquid kaletra.  If she is not able to swallow this medication she will let me know.  I will obtain a baseline CD4ct and VL and then bring hr back in 6 weeks. Diagnostics Reviewed:  CD4: 600 (09/07/2008)   WBC: 7.6 (09/06/2008)   Hgb: 11.7 (09/06/2008)   HCT: 36.2 (09/06/2008)   Platelets: 333 (09/06/2008) HIV-1 RNA: 107 (09/06/2008)   HBSAg: No (04/13/2006)  Orders: Est. Patient Level III (04540)  Medications Added to Medication List This Visit: 1)  Combivir 150-300 Mg Tabs (Lamivudine-zidovudine) .... Take 1 tablet by mouth two times a day 2)  Kaletra 400-100 Mg/69ml Soln (Lopinavir-ritonavir) .... 5ml by mouth twice a day  Other Orders: T-CD4SP ([facility]) (CD4SP) T-HIV Viral Load [id number]) Future Orders: T-CD4SP ([facility]) (CD4SP) ... 02/20/2009 T-HIV Viral Load [id number]) ... 02/20/2009 T-Comprehensive Metabolic Panel [id number]) .Marland KitchenMarland Kitchen  02/20/2009 T-CBC w/Diff ([id number]) ... 02/20/2009  Patient Instructions: 1)  Please schedule a follow-up appointment in 8 weeks.  Prescriptions: COMBIVIR 150-300 MG TABS (LAMIVUDINE-ZIDOVUDINE) Take 1 tablet by mouth two times a day  #60 x 5   Entered and Authorized by:   Yisroel Ramming MD   Signed by:   Yisroel Ramming MD on 01/09/2009   Method used:   Print then Give to Patient   RxID:   [id number] 400-100 MG/5ML SOLN (LOPINAVIR-RITONAVIR) 5ml by mouth twice a day  #373ml x 5   Entered and  Authorized by:   Yisroel Ramming MD   Signed by:   Yisroel Ramming MD on 01/09/2009   Method used:   Print then Give to Patient   RxID:   [id number]  Process Orders Check Orders Results:     [facility]: ABN not required for this insurance Tests Sent for requisitioning (January 09, 2009 4:30 PM):     01/09/2009: [facility] -- T-HIV Viral Load [id number] (signed)     02/20/2009: [facility] -- T-HIV Viral Load [id number] (signed)     02/20/2009: [facility] -- T-Comprehensive Metabolic Panel [[id number]] (signed)     02/20/2009: [facility] -- [facility] w/Diff [[id number]] (signed)

## 2010-05-05 LAB — URINE MICROSCOPIC-ADD ON

## 2010-05-05 LAB — CBC
HCT: 31.5 % — ABNORMAL LOW (ref 36.0–46.0)
Hemoglobin: 10.6 g/dL — ABNORMAL LOW (ref 12.0–15.0)
MCHC: 33.5 g/dL (ref 30.0–36.0)
MCV: 79.4 fL (ref 78.0–100.0)
Platelets: 198 10*3/uL (ref 150–400)
RBC: 3.97 MIL/uL (ref 3.87–5.11)
RDW: 14.9 % (ref 11.5–15.5)
WBC: 5.9 10*3/uL (ref 4.0–10.5)

## 2010-05-05 LAB — PREGNANCY, URINE: Preg Test, Ur: NEGATIVE

## 2010-05-05 LAB — COMPREHENSIVE METABOLIC PANEL
ALT: 15 U/L (ref 0–35)
AST: 21 U/L (ref 0–37)
Albumin: 2.6 g/dL — ABNORMAL LOW (ref 3.5–5.2)
Alkaline Phosphatase: 46 U/L (ref 39–117)
BUN: 4 mg/dL — ABNORMAL LOW (ref 6–23)
CO2: 32 mEq/L (ref 19–32)
Calcium: 8.1 mg/dL — ABNORMAL LOW (ref 8.4–10.5)
Chloride: 102 mEq/L (ref 96–112)
Creatinine, Ser: 0.53 mg/dL (ref 0.4–1.2)
GFR calc Af Amer: >60 mL/min (ref >60)
GFR calc non Af Amer: >60 mL/min (ref >60)
Glucose, Bld: 88 mg/dL (ref 70–99)
Potassium: 2.9 mEq/L — ABNORMAL LOW (ref 3.5–5.1)
Sodium: 139 mEq/L (ref 135–145)
Total Bilirubin: 0.5 mg/dL (ref 0.3–1.2)
Total Protein: 6.6 g/dL (ref 6.0–8.3)

## 2010-05-05 LAB — URINALYSIS, ROUTINE W REFLEX MICROSCOPIC
Glucose, UA: NEGATIVE mg/dL
Hgb urine dipstick: NEGATIVE
Leukocytes, UA: NEGATIVE
Nitrite: NEGATIVE
Protein, ur: 100 mg/dL — AB
Specific Gravity, Urine: 1.032 — ABNORMAL HIGH (ref 1.005–1.030)
Urobilinogen, UA: 2 mg/dL — ABNORMAL HIGH (ref 0.0–1.0)
pH: 6 (ref 5.0–8.0)

## 2010-05-05 LAB — DIFFERENTIAL
Basophils Absolute: 0 10*3/uL (ref 0.0–0.1)
Basophils Relative: 0 % (ref 0–1)
Eosinophils Absolute: 0 10*3/uL (ref 0.0–0.7)
Eosinophils Relative: 0 % (ref 0–5)
Lymphocytes Relative: 41 % (ref 12–46)
Lymphs Abs: 2.4 10*3/uL (ref 0.7–4.0)
Monocytes Absolute: 0.4 10*3/uL (ref 0.1–1.0)
Monocytes Relative: 8 % (ref 3–12)
Neutro Abs: 3.1 10*3/uL (ref 1.7–7.7)
Neutrophils Relative %: 51 % (ref 43–77)

## 2010-05-05 LAB — LIPASE, BLOOD: Lipase: 17 U/L (ref 11–59)

## 2010-05-07 LAB — T-HELPER CELL (CD4) - (RCID CLINIC ONLY)
CD4 % Helper T Cell: 15 % — ABNORMAL LOW (ref 33–55)
CD4 T Cell Abs: 390 uL — ABNORMAL LOW (ref 400–2700)

## 2010-05-22 LAB — T-HELPER CELL (CD4) - (RCID CLINIC ONLY)
CD4 % Helper T Cell: 23 % — ABNORMAL LOW (ref 33–55)
CD4 T Cell Abs: 800 uL (ref 400–2700)

## 2010-05-26 LAB — T-HELPER CELL (CD4) - (RCID CLINIC ONLY)
CD4 % Helper T Cell: 20 % — ABNORMAL LOW (ref 33–55)
CD4 T Cell Abs: 600 uL (ref 400–2700)

## 2010-05-29 LAB — T-HELPER CELL (CD4) - (RCID CLINIC ONLY)
CD4 % Helper T Cell: 21 % — ABNORMAL LOW (ref 33–55)
CD4 T Cell Abs: 550 uL (ref 400–2700)

## 2010-07-16 ENCOUNTER — Other Ambulatory Visit: Payer: Self-pay | Admitting: *Deleted

## 2010-07-16 DIAGNOSIS — B20 Human immunodeficiency virus [HIV] disease: Secondary | ICD-10-CM

## 2010-07-16 MED ORDER — LAMIVUDINE-ZIDOVUDINE 150-300 MG PO TABS: 1.0000 | ORAL_TABLET | Freq: Two times a day (BID) | ORAL | Status: DC

## 2010-07-16 MED ORDER — LOPINAVIR-RITONAVIR 200-50 MG PO TABS: 2.0000 | ORAL_TABLET | Freq: Two times a day (BID) | ORAL | Status: DC

## 2010-07-17 ENCOUNTER — Other Ambulatory Visit (INDEPENDENT_AMBULATORY_CARE_PROVIDER_SITE_OTHER): Payer: Managed Care, Other (non HMO)

## 2010-07-17 ENCOUNTER — Other Ambulatory Visit: Payer: Self-pay | Admitting: Infectious Diseases

## 2010-07-17 DIAGNOSIS — Z113 Encounter for screening for infections with a predominantly sexual mode of transmission: Secondary | ICD-10-CM

## 2010-07-17 DIAGNOSIS — B20 Human immunodeficiency virus [HIV] disease: Secondary | ICD-10-CM

## 2010-07-17 DIAGNOSIS — Z79899 Other long term (current) drug therapy: Secondary | ICD-10-CM

## 2010-07-18 LAB — COMPLETE METABOLIC PANEL WITH GFR
ALT: 20 U/L (ref 0–35)
AST: 19 U/L (ref 0–37)
Albumin: 4.1 g/dL (ref 3.5–5.2)
Alkaline Phosphatase: 58 U/L (ref 39–117)
BUN: 9 mg/dL (ref 6–23)
CO2: 23 mEq/L (ref 19–32)
Calcium: 8.7 mg/dL (ref 8.4–10.5)
Chloride: 110 mEq/L (ref 96–112)
Creat: 0.68 mg/dL (ref 0.40–1.20)
GFR, Est African American: >60 mL/min (ref >60)
GFR, Est Non African American: >60 mL/min (ref >60)
Glucose, Bld: 96 mg/dL (ref 70–99)
Potassium: 4.2 mEq/L (ref 3.5–5.3)
Sodium: 140 mEq/L (ref 135–145)
Total Bilirubin: 0.2 mg/dL — ABNORMAL LOW (ref 0.3–1.2)
Total Protein: 7.1 g/dL (ref 6.0–8.3)

## 2010-07-18 LAB — LIPID PANEL
Cholesterol: 170 mg/dL (ref 0–200)
HDL: 51 mg/dL (ref >39)
LDL Cholesterol: 102 mg/dL — ABNORMAL HIGH (ref 0–99)
Total CHOL/HDL Ratio: 3.3 Ratio
Triglycerides: 84 mg/dL (ref <150)
VLDL: 17 mg/dL (ref 0–40)

## 2010-07-18 LAB — CBC WITH DIFFERENTIAL/PLATELET
Basophils Absolute: 0 10*3/uL (ref 0.0–0.1)
Basophils Relative: 1 % (ref 0–1)
Eosinophils Absolute: 0.1 10*3/uL (ref 0.0–0.7)
Eosinophils Relative: 3 % (ref 0–5)
HCT: 30.8 % — ABNORMAL LOW (ref 36.0–46.0)
Hemoglobin: 10.4 g/dL — ABNORMAL LOW (ref 12.0–15.0)
Lymphocytes Relative: 51 % — ABNORMAL HIGH (ref 12–46)
Lymphs Abs: 2.1 10*3/uL (ref 0.7–4.0)
MCH: 29.6 pg (ref 26.0–34.0)
MCHC: 33.8 g/dL (ref 30.0–36.0)
MCV: 87.7 fL (ref 78.0–100.0)
Monocytes Absolute: 0.5 10*3/uL (ref 0.1–1.0)
Monocytes Relative: 12 % (ref 3–12)
Neutro Abs: 1.4 10*3/uL — ABNORMAL LOW (ref 1.7–7.7)
Neutrophils Relative %: 33 % — ABNORMAL LOW (ref 43–77)
Platelets: 281 10*3/uL (ref 150–400)
RBC: 3.51 MIL/uL — ABNORMAL LOW (ref 3.87–5.11)
RDW: 14.4 % (ref 11.5–15.5)
WBC: 4.1 10*3/uL (ref 4.0–10.5)

## 2010-07-18 LAB — HIV-1 RNA QUANT-NO REFLEX-BLD
HIV 1 RNA Quant: <20 copies/mL (ref <20)
HIV-1 RNA Quant, Log: <1.3 {Log} (ref <1.30)

## 2010-07-18 LAB — T-HELPER CELL (CD4) - (RCID CLINIC ONLY)
CD4 % Helper T Cell: 12 % — ABNORMAL LOW (ref 33–55)
CD4 T Cell Abs: 240 uL — ABNORMAL LOW (ref 400–2700)

## 2010-07-18 LAB — RPR

## 2010-07-31 ENCOUNTER — Ambulatory Visit: Payer: Self-pay | Admitting: Adult Health

## 2010-08-02 ENCOUNTER — Encounter: Payer: Self-pay | Admitting: Internal Medicine

## 2010-08-02 ENCOUNTER — Ambulatory Visit (INDEPENDENT_AMBULATORY_CARE_PROVIDER_SITE_OTHER): Payer: Managed Care, Other (non HMO) | Admitting: Internal Medicine

## 2010-08-02 DIAGNOSIS — B20 Human immunodeficiency virus [HIV] disease: Secondary | ICD-10-CM

## 2010-08-02 DIAGNOSIS — Z23 Encounter for immunization: Secondary | ICD-10-CM

## 2010-08-02 LAB — T-HELPER CELL (CD4) - (RCID CLINIC ONLY)
CD4 % Helper T Cell: 7 % — ABNORMAL LOW (ref 33–55)
CD4 T Cell Abs: 220 uL — ABNORMAL LOW (ref 400–2700)

## 2010-08-02 MED ORDER — EMTRICITAB-RILPIVIR-TENOFOV DF 200-25-300 MG PO TABS: 1.0000 | ORAL_TABLET | Freq: Every day | ORAL | Status: DC
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
## 2010-08-02 NOTE — Assessment & Plan Note (Signed)
Her regimen has worked well but I am concerned about long term side effects.  She reports that she takes medicines daily without issue.  She states that she would prefer a once a day regimen.  I discussed options with her including Complera and PIs.  I stated that long term we do not have much experience with pregnancy and Complera and therefore if she decides to become sexually active, that she may be better off with a PI.  She states that she is comfortable taking Complera and will notify us if anything changes and prefers the one pill a day option.  Will therefore start her on Complera.   F/u 6 weeks with labs and check VL and genotype today since she has been off meds.  Pap due.  Pneumovax today.

## 2010-08-07 LAB — HIV-1 RNA ULTRAQUANT REFLEX TO GENTYP+
HIV 1 RNA Quant: 15600 copies/mL — ABNORMAL HIGH (ref <20)
HIV-1 RNA Quant, Log: 4.19 {Log} — ABNORMAL HIGH (ref <1.30)

## 2010-08-15 LAB — HIV-1 GENOTYPR PLUS

## 2010-11-11 LAB — T-HELPER CELL (CD4) - (RCID CLINIC ONLY)
CD4 % Helper T Cell: 18 — ABNORMAL LOW
CD4 T Cell Abs: 430

## 2010-11-18 LAB — T-HELPER CELL (CD4) - (RCID CLINIC ONLY)
CD4 % Helper T Cell: 19 — ABNORMAL LOW
CD4 T Cell Abs: 500

## 2010-11-27 LAB — T-HELPER CELL (CD4) - (RCID CLINIC ONLY)
CD4 % Helper T Cell: 28 — ABNORMAL LOW
CD4 T Cell Abs: 840

## 2010-12-15 ENCOUNTER — Emergency Department (HOSPITAL_COMMUNITY)
Admission: EM | Admit: 2010-12-15 | Discharge: 2010-12-15 | Disposition: A | Discharge status: Home / Self Care | Payer: Managed Care, Other (non HMO) | Attending: Emergency Medicine | Admitting: Emergency Medicine

## 2010-12-15 ENCOUNTER — Emergency Department (HOSPITAL_COMMUNITY): Payer: Managed Care, Other (non HMO)

## 2010-12-15 DIAGNOSIS — R079 Chest pain, unspecified: Secondary | ICD-10-CM | POA: Insufficient documentation

## 2010-12-15 DIAGNOSIS — M545 Low back pain, unspecified: Secondary | ICD-10-CM | POA: Insufficient documentation

## 2010-12-15 DIAGNOSIS — I10 Essential (primary) hypertension: Secondary | ICD-10-CM | POA: Insufficient documentation

## 2010-12-15 DIAGNOSIS — M79609 Pain in unspecified limb: Secondary | ICD-10-CM | POA: Insufficient documentation

## 2010-12-15 DIAGNOSIS — M25519 Pain in unspecified shoulder: Secondary | ICD-10-CM | POA: Insufficient documentation

## 2011-01-16 ENCOUNTER — Encounter: Payer: Self-pay | Admitting: *Deleted

## 2011-02-13 ENCOUNTER — Telehealth: Payer: Self-pay | Admitting: *Deleted

## 2011-02-13 NOTE — Telephone Encounter (Signed)
Number listed in patient chart is not a working number. Not able to get in touch with patient.

## 2011-02-27 ENCOUNTER — Other Ambulatory Visit: Payer: Managed Care, Other (non HMO)

## 2011-02-27 ENCOUNTER — Other Ambulatory Visit: Payer: Self-pay | Admitting: Internal Medicine

## 2011-02-27 DIAGNOSIS — B20 Human immunodeficiency virus [HIV] disease: Secondary | ICD-10-CM

## 2011-02-28 LAB — CBC WITH DIFFERENTIAL/PLATELET
Basophils Absolute: 0 10*3/uL (ref 0.0–0.1)
Basophils Relative: 0 % (ref 0–1)
Eosinophils Absolute: 0.1 10*3/uL (ref 0.0–0.7)
Eosinophils Relative: 2 % (ref 0–5)
HCT: 35.6 % — ABNORMAL LOW (ref 36.0–46.0)
Hemoglobin: 11.6 g/dL — ABNORMAL LOW (ref 12.0–15.0)
Lymphocytes Relative: 54 % — ABNORMAL HIGH (ref 12–46)
Lymphs Abs: 2.5 10*3/uL (ref 0.7–4.0)
MCH: 26.6 pg (ref 26.0–34.0)
MCHC: 32.6 g/dL (ref 30.0–36.0)
MCV: 81.7 fL (ref 78.0–100.0)
Monocytes Absolute: 0.6 10*3/uL (ref 0.1–1.0)
Monocytes Relative: 13 % — ABNORMAL HIGH (ref 3–12)
Neutro Abs: 1.4 10*3/uL — ABNORMAL LOW (ref 1.7–7.7)
Neutrophils Relative %: 31 % — ABNORMAL LOW (ref 43–77)
Platelets: 263 10*3/uL (ref 150–400)
RBC: 4.36 MIL/uL (ref 3.87–5.11)
RDW: 14.7 % (ref 11.5–15.5)
WBC: 4.6 10*3/uL (ref 4.0–10.5)

## 2011-02-28 LAB — COMPLETE METABOLIC PANEL WITH GFR
ALT: 24 U/L (ref 0–35)
AST: 21 U/L (ref 0–37)
Albumin: 3.9 g/dL (ref 3.5–5.2)
Alkaline Phosphatase: 82 U/L (ref 39–117)
BUN: 10 mg/dL (ref 6–23)
CO2: 24 mEq/L (ref 19–32)
Calcium: 8.4 mg/dL (ref 8.4–10.5)
Chloride: 107 mEq/L (ref 96–112)
Creat: 0.8 mg/dL (ref 0.50–1.10)
GFR, Est African American: >89 mL/min
GFR, Est Non African American: >89 mL/min
Glucose, Bld: 82 mg/dL (ref 70–99)
Potassium: 4 mEq/L (ref 3.5–5.3)
Sodium: 138 mEq/L (ref 135–145)
Total Bilirubin: 0.3 mg/dL (ref 0.3–1.2)
Total Protein: 7.7 g/dL (ref 6.0–8.3)

## 2011-02-28 LAB — T-HELPER CELL (CD4) - (RCID CLINIC ONLY)
CD4 % Helper T Cell: 9 % — ABNORMAL LOW (ref 33–55)
CD4 T Cell Abs: 250 uL — ABNORMAL LOW (ref 400–2700)

## 2011-03-03 LAB — HIV-1 RNA QUANT-NO REFLEX-BLD
HIV 1 RNA Quant: 14403 copies/mL — ABNORMAL HIGH (ref <20)
HIV-1 RNA Quant, Log: 4.16 {Log} — ABNORMAL HIGH (ref <1.30)

## 2011-03-06 ENCOUNTER — Other Ambulatory Visit: Payer: Self-pay | Admitting: *Deleted

## 2011-03-06 DIAGNOSIS — B20 Human immunodeficiency virus [HIV] disease: Secondary | ICD-10-CM

## 2011-03-06 MED ORDER — EMTRICITAB-RILPIVIR-TENOFOV DF 200-25-300 MG PO TABS: 1.0000 | ORAL_TABLET | Freq: Every day | ORAL | Status: DC

## 2011-03-13 ENCOUNTER — Encounter: Payer: Self-pay | Admitting: Internal Medicine

## 2011-03-13 ENCOUNTER — Ambulatory Visit (INDEPENDENT_AMBULATORY_CARE_PROVIDER_SITE_OTHER): Payer: Self-pay | Admitting: Internal Medicine

## 2011-03-13 ENCOUNTER — Other Ambulatory Visit: Payer: Self-pay | Admitting: Internal Medicine

## 2011-03-13 ENCOUNTER — Telehealth: Payer: Self-pay | Admitting: *Deleted

## 2011-03-13 VITALS — BP 151/100 | HR 80 | Temp 98.4°F | Ht 67.0 in | Wt 257.0 lb

## 2011-03-13 DIAGNOSIS — F3289 Other specified depressive episodes: Secondary | ICD-10-CM

## 2011-03-13 DIAGNOSIS — F329 Major depressive disorder, single episode, unspecified: Secondary | ICD-10-CM

## 2011-03-13 DIAGNOSIS — B20 Human immunodeficiency virus [HIV] disease: Secondary | ICD-10-CM

## 2011-03-13 MED ORDER — TRAZODONE HCL 50 MG PO TABS: 50.0000 mg | ORAL_TABLET | Freq: Every day | ORAL | Status: DC

## 2011-03-13 NOTE — Assessment & Plan Note (Addendum)
Despite reported compliance, she is at her baseline. I suspect resistance.  I will therefore check a genotype today.  I have told her to stop the Complera until that returns and to get started on ADAP now.  She will return in 2 weeks.  She will be scheduled for a PAP soon.

## 2011-03-13 NOTE — Progress Notes (Signed)
  Subjective:    Patient ID: Ariel Cox, female    DOB: August 06, 1970, 41 y.o.   MRN: [id number]  HPI she comes in for follow up of her HIV.  She was started on Complera in June after being out of her previous regimen of Truvada and Kaletra.  After starting Complera though, the patient did not return as scheduled until today, with labs 2 weeks ago.  Her issue now is that she lost her insurance and is about to run out of medications and needs ADAP.  Her big issue also is that, despite Complera, which she affirms she is taking without fail, her viral load is at her baseline level.  She also complains of fatigue, difficulty sleeping and depression.      Review of Systems  Constitutional: Positive for activity change and fatigue. Negative for fever, chills and unexpected weight change.  HENT: Negative for sore throat and trouble swallowing.   Eyes: Negative for redness.  Respiratory: Negative for cough and shortness of breath.   Cardiovascular: Negative for chest pain and leg swelling.  Gastrointestinal: Negative for nausea, vomiting, abdominal pain and diarrhea.  Genitourinary: Negative for dysuria, frequency and genital sores.  Musculoskeletal: Negative for myalgias, arthralgias and gait problem.  Skin: Negative for rash.  Neurological: Negative for dizziness, weakness, light-headedness and numbness.  Hematological: Negative for adenopathy.  Psychiatric/Behavioral: Positive for sleep disturbance and dysphoric mood. Negative for suicidal ideas and self-injury. The patient is not nervous/anxious.        Objective:   Physical Exam  Constitutional: She is oriented to person, place, and time. She appears well-developed and well-nourished. No distress.  HENT:  Mouth/Throat: No oropharyngeal exudate.  Cardiovascular: Normal rate, regular rhythm and normal heart sounds.  Exam reveals no gallop and no friction rub.   No murmur heard. Pulmonary/Chest: Effort normal and breath sounds normal. No  respiratory distress. She has no wheezes. She has no rales.  Abdominal: Soft. Bowel sounds are normal. She exhibits no distension. There is no tenderness. There is no rebound.  Lymphadenopathy:    She has no cervical adenopathy.  Neurological: She is oriented to person, place, and time.          Assessment & Plan:

## 2011-03-13 NOTE — Patient Instructions (Signed)
Stop taking complera

## 2011-03-13 NOTE — Assessment & Plan Note (Signed)
I will start with trazodone to help with sleep and depression.  Follow up in 2 weeks.

## 2011-03-13 NOTE — Telephone Encounter (Signed)
Called patient she is overdue for a PAP, she will schedule an appointment at her next office visit in 2 weeks. Wendall Mola CMA

## 2011-03-17 ENCOUNTER — Telehealth: Payer: Self-pay | Admitting: *Deleted

## 2011-03-17 NOTE — Telephone Encounter (Signed)
Call from Tammy at [facility], patient's ADAP has been approved.  Authorization # [id number].  Patient will be given new RX after Dr. Luciana Axe reviews her labs. Wendall Mola CMA

## 2011-03-18 LAB — HIV-1 RNA ULTRAQUANT REFLEX TO GENTYP+
HIV 1 RNA Quant: 11252 copies/mL — ABNORMAL HIGH (ref <20)
HIV-1 RNA Quant, Log: 4.05 {Log} — ABNORMAL HIGH (ref <1.30)

## 2011-03-25 LAB — HIV-1 GENOTYPR PLUS

## 2011-03-27 ENCOUNTER — Encounter: Payer: Self-pay | Admitting: Internal Medicine

## 2011-03-27 ENCOUNTER — Ambulatory Visit (INDEPENDENT_AMBULATORY_CARE_PROVIDER_SITE_OTHER): Payer: Self-pay | Admitting: Internal Medicine

## 2011-03-27 ENCOUNTER — Telehealth: Payer: Self-pay | Admitting: Internal Medicine

## 2011-03-27 ENCOUNTER — Ambulatory Visit: Payer: Self-pay | Admitting: Internal Medicine

## 2011-03-27 VITALS — BP 140/89 | HR 88 | Temp 98.3°F | Ht 67.0 in | Wt 266.0 lb

## 2011-03-27 DIAGNOSIS — B20 Human immunodeficiency virus [HIV] disease: Secondary | ICD-10-CM

## 2011-03-27 DIAGNOSIS — F329 Major depressive disorder, single episode, unspecified: Secondary | ICD-10-CM

## 2011-03-27 DIAGNOSIS — F3289 Other specified depressive episodes: Secondary | ICD-10-CM

## 2011-03-27 MED ORDER — DARUNAVIR ETHANOLATE 800 MG PO TABS: 1.0000 | ORAL_TABLET | Freq: Every day | ORAL | Status: DC

## 2011-03-27 MED ORDER — RITONAVIR 100 MG PO TABS: 100.0000 mg | ORAL_TABLET | Freq: Every day | ORAL | Status: DC

## 2011-03-27 MED ORDER — ZIDOVUDINE 300 MG PO TABS: 300.0000 mg | ORAL_TABLET | Freq: Every day | ORAL | Status: DC

## 2011-03-27 MED ORDER — LAMIVUDINE-ZIDOVUDINE 150-300 MG PO TABS: 1.0000 | ORAL_TABLET | Freq: Two times a day (BID) | ORAL | Status: DC

## 2011-03-27 MED ORDER — RALTEGRAVIR POTASSIUM 400 MG PO TABS: 400.0000 mg | ORAL_TABLET | Freq: Two times a day (BID) | ORAL | Status: DC

## 2011-03-28 ENCOUNTER — Encounter: Payer: Self-pay | Admitting: Internal Medicine

## 2011-03-28 ENCOUNTER — Telehealth: Payer: Self-pay | Admitting: *Deleted

## 2011-03-28 NOTE — Progress Notes (Signed)
  Subjective:    Patient ID: Ariel Cox, female    DOB: 12/05/1970, 41 y.o.   MRN: [id number]  HPI She comes in for follow up of her failing regimen. She was started on Complera in June but never followed up again.  When she returned 2 weeks ago, her labs revealed a baseline viral load and a genotype was sent off.  The genotype shows multiple resistance mutations including  K70E,V90I,A98G,V108I,E138G/K/R,Y181C,M184V.  She has a history of treatment of Kaletra and Combivir in the past.      Review of Systems  Constitutional: Negative for fever, chills, fatigue and unexpected weight change.  HENT: Negative for sore throat and trouble swallowing.   Respiratory: Negative for cough and shortness of breath.   Cardiovascular: Negative for chest pain, palpitations and leg swelling.  Gastrointestinal: Negative for nausea and abdominal pain.  Musculoskeletal: Negative for myalgias and arthralgias.  Skin: Negative for pallor and rash.  Neurological: Negative for dizziness, weakness and headaches.  Hematological: Negative for adenopathy.  Psychiatric/Behavioral: Positive for dysphoric mood. The patient is not nervous/anxious.        Objective:   Physical Exam  Constitutional: She appears well-developed and well-nourished. No distress.  HENT:  Mouth/Throat: Oropharynx is clear and moist. No oropharyngeal exudate.  Cardiovascular: Normal rate, regular rhythm and normal heart sounds.  Exam reveals no gallop and no friction rub.   No murmur heard. Pulmonary/Chest: Effort normal and breath sounds normal. No respiratory distress. She has no wheezes. She has no rales.  Abdominal: Soft. Bowel sounds are normal. She exhibits no distension. There is no tenderness.  Lymphadenopathy:    She has no cervical adenopathy.  Skin: Skin is warm and dry. No rash noted. No erythema.          Assessment & Plan:

## 2011-03-28 NOTE — Telephone Encounter (Signed)
Patient notified of change in regimen. She will pick up her new medication and start it today.  [facility] pharmacist also notified. Wendall Mola CMA

## 2011-03-28 NOTE — Assessment & Plan Note (Signed)
She continues to be depressed and will be referred to [facility].

## 2011-03-28 NOTE — Assessment & Plan Note (Signed)
I discussed starting a new regimen and the absolute importance of compliance and she appears motivated to comply.  I will start her on Isentress, boosted Prezista and Combivir.  She was instructed to return in 2-3 weeks for follow up lab to assure compliance and follow her closely.

## 2011-03-28 NOTE — Telephone Encounter (Signed)
error 

## 2011-04-17 ENCOUNTER — Other Ambulatory Visit (INDEPENDENT_AMBULATORY_CARE_PROVIDER_SITE_OTHER): Payer: Self-pay

## 2011-04-17 DIAGNOSIS — B20 Human immunodeficiency virus [HIV] disease: Secondary | ICD-10-CM

## 2011-04-18 LAB — T-HELPER CELL (CD4) - (RCID CLINIC ONLY)
CD4 % Helper T Cell: 10 % — ABNORMAL LOW (ref 33–55)
CD4 T Cell Abs: 220 uL — ABNORMAL LOW (ref 400–2700)

## 2011-04-21 LAB — HIV-1 RNA ULTRAQUANT REFLEX TO GENTYP+
HIV 1 RNA Quant: 24 copies/mL — ABNORMAL HIGH (ref <20)
HIV-1 RNA Quant, Log: 1.38 {Log} — ABNORMAL HIGH (ref <1.30)

## 2011-05-01 ENCOUNTER — Ambulatory Visit (INDEPENDENT_AMBULATORY_CARE_PROVIDER_SITE_OTHER): Payer: Self-pay | Admitting: Internal Medicine

## 2011-05-01 ENCOUNTER — Encounter: Payer: Self-pay | Admitting: Internal Medicine

## 2011-05-01 VITALS — BP 138/90 | HR 67 | Temp 97.8°F | Ht 67.0 in | Wt 260.1 lb

## 2011-05-01 DIAGNOSIS — I10 Essential (primary) hypertension: Secondary | ICD-10-CM

## 2011-05-01 DIAGNOSIS — B20 Human immunodeficiency virus [HIV] disease: Secondary | ICD-10-CM

## 2011-05-01 NOTE — Progress Notes (Signed)
  Subjective:    Patient ID: Ariel Cox, female    DOB: 09/26/1970, 41 y.o.   MRN: [id number]  HPI Herree for follow up after starting a salvage regimen.  She was started on Complera in June 2012 but never followed up again. When she returned, a genotype showed multiple resistance mutations including  K70E,V90I,A98G,V108I,E138G/K/R,Y181C,M184V. She has a history of treatment of Kaletra and Combivir in the past.  She was started on Combivir, boosted Prezista, Isentress.  She is tolerating it well, no missed doses.  Viral load now down to 24!  CD 4 stable.   Has not yet been to [facility].     Review of Systems  Constitutional: Negative for fever, chills, fatigue and unexpected weight change.  Respiratory: Negative for shortness of breath.   Cardiovascular: Negative for chest pain and leg swelling.  Musculoskeletal: Negative for arthralgias.  Skin: Negative for rash.  Neurological: Positive for headaches.  Psychiatric/Behavioral: Positive for dysphoric mood. Negative for sleep disturbance and self-injury. The patient is not nervous/anxious.        Objective:   Physical Exam  Constitutional: She appears well-developed and well-nourished.       Obese   Cardiovascular: Normal rate, regular rhythm and normal heart sounds.  Exam reveals no gallop and no friction rub.   No murmur heard. Skin: No rash noted. No erythema.  Psychiatric: She has a normal mood and affect. Her behavior is normal.          Assessment & Plan:

## 2011-05-01 NOTE — Assessment & Plan Note (Signed)
Mildly elevated, will continue to watch.  

## 2011-05-01 NOTE — Assessment & Plan Note (Signed)
She is doing great on the new regimen,.  No difficulties with bid dosing.  Feels much better and very pleased with results.  No sexual actiivity.  Reminded to use condoms. Return in 3 months for repeat labs.  Encouraged her to go to [place].    To get PAP.

## 2011-05-28 ENCOUNTER — Other Ambulatory Visit: Payer: Self-pay | Admitting: *Deleted

## 2011-05-28 DIAGNOSIS — Z113 Encounter for screening for infections with a predominantly sexual mode of transmission: Secondary | ICD-10-CM

## 2011-07-24 ENCOUNTER — Telehealth: Payer: Self-pay | Admitting: *Deleted

## 2011-07-24 ENCOUNTER — Other Ambulatory Visit (INDEPENDENT_AMBULATORY_CARE_PROVIDER_SITE_OTHER): Payer: Self-pay

## 2011-07-24 DIAGNOSIS — B20 Human immunodeficiency virus [HIV] disease: Secondary | ICD-10-CM

## 2011-07-24 LAB — CBC WITH DIFFERENTIAL/PLATELET
Basophils Absolute: 0 10*3/uL (ref 0.0–0.1)
Basophils Relative: 0 % (ref 0–1)
Eosinophils Absolute: 0.1 10*3/uL (ref 0.0–0.7)
Eosinophils Relative: 2 % (ref 0–5)
HCT: 32.7 % — ABNORMAL LOW (ref 36.0–46.0)
Hemoglobin: 10.6 g/dL — ABNORMAL LOW (ref 12.0–15.0)
Lymphocytes Relative: 51 % — ABNORMAL HIGH (ref 12–46)
Lymphs Abs: 2.1 10*3/uL (ref 0.7–4.0)
MCH: 29.1 pg (ref 26.0–34.0)
MCHC: 32.4 g/dL (ref 30.0–36.0)
MCV: 89.8 fL (ref 78.0–100.0)
Monocytes Absolute: 0.5 10*3/uL (ref 0.1–1.0)
Monocytes Relative: 12 % (ref 3–12)
Neutro Abs: 1.5 10*3/uL — ABNORMAL LOW (ref 1.7–7.7)
Neutrophils Relative %: 35 % — ABNORMAL LOW (ref 43–77)
Platelets: 349 10*3/uL (ref 150–400)
RBC: 3.64 MIL/uL — ABNORMAL LOW (ref 3.87–5.11)
RDW: 16.2 % — ABNORMAL HIGH (ref 11.5–15.5)
WBC: 4.2 10*3/uL (ref 4.0–10.5)

## 2011-07-24 LAB — COMPLETE METABOLIC PANEL WITH GFR
ALT: 13 U/L (ref 0–35)
AST: 16 U/L (ref 0–37)
Albumin: 3.5 g/dL (ref 3.5–5.2)
Alkaline Phosphatase: 73 U/L (ref 39–117)
BUN: 9 mg/dL (ref 6–23)
CO2: 23 mEq/L (ref 19–32)
Calcium: 8.2 mg/dL — ABNORMAL LOW (ref 8.4–10.5)
Chloride: 106 mEq/L (ref 96–112)
Creat: 0.91 mg/dL (ref 0.50–1.10)
GFR, Est African American: >89 mL/min
GFR, Est Non African American: 79 mL/min
Glucose, Bld: 139 mg/dL — ABNORMAL HIGH (ref 70–99)
Potassium: 4 mEq/L (ref 3.5–5.3)
Sodium: 137 mEq/L (ref 135–145)
Total Bilirubin: 0.3 mg/dL (ref 0.3–1.2)
Total Protein: 7 g/dL (ref 6.0–8.3)

## 2011-07-24 NOTE — Telephone Encounter (Signed)
Last PAP smear 2010 per [facility] records.  Offered to do PAP smear when patient comes to see Dr. Luciana Axe 08/07/11 or she can schedule during PAP clinic time.

## 2011-07-25 LAB — T-HELPER CELL (CD4) - (RCID CLINIC ONLY)
CD4 % Helper T Cell: 10 % — ABNORMAL LOW (ref 33–55)
CD4 T Cell Abs: 200 uL — ABNORMAL LOW (ref 400–2700)

## 2011-07-28 LAB — HIV-1 RNA ULTRAQUANT REFLEX TO GENTYP+
HIV 1 RNA Quant: <20 copies/mL (ref <20)
HIV-1 RNA Quant, Log: <1.3 {Log} (ref <1.30)

## 2011-08-07 ENCOUNTER — Ambulatory Visit (INDEPENDENT_AMBULATORY_CARE_PROVIDER_SITE_OTHER): Payer: Self-pay | Admitting: Internal Medicine

## 2011-08-07 ENCOUNTER — Encounter: Payer: Self-pay | Admitting: Internal Medicine

## 2011-08-07 VITALS — BP 144/86 | HR 96 | Temp 98.0°F | Ht 67.0 in | Wt 265.8 lb

## 2011-08-07 DIAGNOSIS — Z1231 Encounter for screening mammogram for malignant neoplasm of breast: Secondary | ICD-10-CM

## 2011-08-07 DIAGNOSIS — Z124 Encounter for screening for malignant neoplasm of cervix: Secondary | ICD-10-CM

## 2011-08-07 DIAGNOSIS — I10 Essential (primary) hypertension: Secondary | ICD-10-CM

## 2011-08-07 DIAGNOSIS — Z79899 Other long term (current) drug therapy: Secondary | ICD-10-CM

## 2011-08-07 DIAGNOSIS — Z113 Encounter for screening for infections with a predominantly sexual mode of transmission: Secondary | ICD-10-CM

## 2011-08-07 DIAGNOSIS — B20 Human immunodeficiency virus [HIV] disease: Secondary | ICD-10-CM

## 2011-08-07 DIAGNOSIS — Z0000 Encounter for general adult medical examination without abnormal findings: Secondary | ICD-10-CM

## 2011-08-07 DIAGNOSIS — R7309 Other abnormal glucose: Secondary | ICD-10-CM

## 2011-08-08 NOTE — Progress Notes (Signed)
  Subjective:    Patient ID: Ariel Cox, female    DOB: April 13, 1970, 41 y.o.   MRN: [id number]  HPI Here for routine follow up.  She was started on Complera in June 2012 but never followed up again. 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 She has a history of treatment of Kaletra and Combivir in the past. She was started on Combivir, boosted Prezista, Isentress. She is tolerating it well, no missed doses. She is pleased with the regimen.    Review of Systems  Constitutional: Negative for fever, chills, fatigue and unexpected weight change.  HENT: Negative for sore throat and trouble swallowing.   Respiratory: Negative for cough and shortness of breath.   Cardiovascular: Negative for chest pain, palpitations and leg swelling.  Gastrointestinal: Negative for nausea, abdominal pain and diarrhea.  Musculoskeletal: Negative for myalgias, joint swelling and arthralgias.  Skin: Negative for rash.  Hematological: Negative for adenopathy.  Psychiatric/Behavioral: Negative for dysphoric mood. The patient is not nervous/anxious.        Objective:   Physical Exam  Constitutional: She appears well-developed and well-nourished. No distress.  HENT:  Mouth/Throat: Oropharynx is clear and moist. No oropharyngeal exudate.  Cardiovascular: Normal rate, regular rhythm and normal heart sounds.  Exam reveals no gallop and no friction rub.   No murmur heard. Pulmonary/Chest: Effort normal and breath sounds normal. No respiratory distress. She has no wheezes. She has no rales.  Abdominal: Soft. Bowel sounds are normal. She exhibits no distension. There is no tenderness. There is no rebound.  Lymphadenopathy:    She has no cervical adenopathy.  Skin: No rash noted.  Psychiatric: She has a normal mood and affect. Her behavior is normal.          Assessment & Plan:

## 2011-08-08 NOTE — Assessment & Plan Note (Signed)
She is doing well on her salvage regimen.  Excellent compliance.  Now undetectable, though CD4 remains low.  I will send her to IM clinic to establish with a PCP.  She lost her insurance about 1 year ago.  She was reminded to use condoms with all sexual activity.  RTC in 3 months.

## 2011-08-08 NOTE — Assessment & Plan Note (Signed)
Elevated today.  She will follow up with internal medicine to monitor and consider therapy.

## 2011-08-08 NOTE — Assessment & Plan Note (Signed)
She has had mildly elevated blood sugars.  Internal medicine follow up.

## 2011-09-05 ENCOUNTER — Ambulatory Visit (HOSPITAL_COMMUNITY): Admission: RE | Admit: 2011-09-05 | Payer: Medicaid Other | Source: Ambulatory Visit

## 2011-09-15 ENCOUNTER — Other Ambulatory Visit: Payer: Self-pay | Admitting: *Deleted

## 2011-09-15 DIAGNOSIS — B20 Human immunodeficiency virus [HIV] disease: Secondary | ICD-10-CM

## 2011-09-15 MED ORDER — DARUNAVIR ETHANOLATE 800 MG PO TABS: 800.0000 mg | ORAL_TABLET | Freq: Every day | ORAL | Status: DC

## 2011-09-15 MED ORDER — RITONAVIR 100 MG PO TABS: 100.0000 mg | ORAL_TABLET | Freq: Every day | ORAL | Status: DC

## 2011-09-15 MED ORDER — RALTEGRAVIR POTASSIUM 400 MG PO TABS: 400.0000 mg | ORAL_TABLET | Freq: Two times a day (BID) | ORAL | Status: DC

## 2011-09-17 ENCOUNTER — Ambulatory Visit (HOSPITAL_COMMUNITY)
Admission: RE | Admit: 2011-09-17 | Discharge: 2011-09-17 | Disposition: A | Discharge status: Home / Self Care | Payer: Self-pay | Source: Ambulatory Visit | Attending: Internal Medicine | Admitting: Internal Medicine

## 2011-09-17 DIAGNOSIS — Z1231 Encounter for screening mammogram for malignant neoplasm of breast: Secondary | ICD-10-CM

## 2011-11-24 ENCOUNTER — Other Ambulatory Visit (INDEPENDENT_AMBULATORY_CARE_PROVIDER_SITE_OTHER): Payer: Self-pay

## 2011-11-24 DIAGNOSIS — Z113 Encounter for screening for infections with a predominantly sexual mode of transmission: Secondary | ICD-10-CM

## 2011-11-24 DIAGNOSIS — B20 Human immunodeficiency virus [HIV] disease: Secondary | ICD-10-CM

## 2011-11-24 DIAGNOSIS — Z79899 Other long term (current) drug therapy: Secondary | ICD-10-CM

## 2011-11-24 LAB — COMPLETE METABOLIC PANEL WITH GFR
ALT: 11 U/L (ref 0–35)
AST: 13 U/L (ref 0–37)
Albumin: 3.6 g/dL (ref 3.5–5.2)
Alkaline Phosphatase: 68 U/L (ref 39–117)
BUN: 10 mg/dL (ref 6–23)
CO2: 22 mEq/L (ref 19–32)
Calcium: 8.1 mg/dL — ABNORMAL LOW (ref 8.4–10.5)
Chloride: 109 mEq/L (ref 96–112)
Creat: 0.85 mg/dL (ref 0.50–1.10)
GFR, Est African American: >89 mL/min
GFR, Est Non African American: 85 mL/min
Glucose, Bld: 98 mg/dL (ref 70–99)
Potassium: 4.2 mEq/L (ref 3.5–5.3)
Sodium: 139 mEq/L (ref 135–145)
Total Bilirubin: 0.4 mg/dL (ref 0.3–1.2)
Total Protein: 6.7 g/dL (ref 6.0–8.3)

## 2011-11-24 LAB — CBC WITH DIFFERENTIAL/PLATELET
Basophils Absolute: 0 10*3/uL (ref 0.0–0.1)
Basophils Relative: 0 % (ref 0–1)
Eosinophils Absolute: 0.1 10*3/uL (ref 0.0–0.7)
Eosinophils Relative: 1 % (ref 0–5)
HCT: 30.6 % — ABNORMAL LOW (ref 36.0–46.0)
Hemoglobin: 10.2 g/dL — ABNORMAL LOW (ref 12.0–15.0)
Lymphocytes Relative: 41 % (ref 12–46)
Lymphs Abs: 2 10*3/uL (ref 0.7–4.0)
MCH: 29.4 pg (ref 26.0–34.0)
MCHC: 33.3 g/dL (ref 30.0–36.0)
MCV: 88.2 fL (ref 78.0–100.0)
Monocytes Absolute: 0.5 10*3/uL (ref 0.1–1.0)
Monocytes Relative: 10 % (ref 3–12)
Neutro Abs: 2.3 10*3/uL (ref 1.7–7.7)
Neutrophils Relative %: 48 % (ref 43–77)
Platelets: 364 10*3/uL (ref 150–400)
RBC: 3.47 MIL/uL — ABNORMAL LOW (ref 3.87–5.11)
RDW: 14.7 % (ref 11.5–15.5)
WBC: 4.9 10*3/uL (ref 4.0–10.5)

## 2011-11-24 LAB — LIPID PANEL
Cholesterol: 207 mg/dL — ABNORMAL HIGH (ref 0–200)
HDL: 49 mg/dL (ref >39)
LDL Cholesterol: 146 mg/dL — ABNORMAL HIGH (ref 0–99)
Total CHOL/HDL Ratio: 4.2 Ratio
Triglycerides: 58 mg/dL (ref <150)
VLDL: 12 mg/dL (ref 0–40)

## 2011-11-24 LAB — RPR

## 2011-11-25 LAB — HIV-1 RNA QUANT-NO REFLEX-BLD
HIV 1 RNA Quant: <20 copies/mL (ref <20)
HIV-1 RNA Quant, Log: <1.3 {Log} (ref <1.30)

## 2011-11-25 LAB — T-HELPER CELL (CD4) - (RCID CLINIC ONLY)
CD4 % Helper T Cell: 12 % — ABNORMAL LOW (ref 33–55)
CD4 T Cell Abs: 240 uL — ABNORMAL LOW (ref 400–2700)

## 2011-12-11 ENCOUNTER — Ambulatory Visit (INDEPENDENT_AMBULATORY_CARE_PROVIDER_SITE_OTHER): Payer: Self-pay | Admitting: Internal Medicine

## 2011-12-11 ENCOUNTER — Encounter: Payer: Self-pay | Admitting: Internal Medicine

## 2011-12-11 VITALS — BP 152/94 | HR 80 | Temp 97.8°F | Ht 67.5 in | Wt 268.0 lb

## 2011-12-11 DIAGNOSIS — B20 Human immunodeficiency virus [HIV] disease: Secondary | ICD-10-CM

## 2011-12-11 DIAGNOSIS — Z23 Encounter for immunization: Secondary | ICD-10-CM

## 2011-12-11 NOTE — Assessment & Plan Note (Addendum)
Her CD 4 remains over 200 with a slow incline. Her virus remains undetectable and she is happy with her current regimen.  She is going to try to trazodone as to help with her sleep and if it does help, I will give her a day prescription for 100 mg next visit. She also was given information to see our counselor. She will return in 4 months due to the multi mutations.

## 2011-12-11 NOTE — Progress Notes (Signed)
  Subjective:    Patient ID: Ariel Cox, female    DOB: [birth year], 41 y.o.   MRN: [id number]  HPI She comes in for followup. She is on a salvage regimen due to multiple resistance mutations and reports excellent compliance.  She continues to have difficulty sleeping though has been helped by the trazodone in initiating sleep though wakes up after a few hours. She also has continued problems with anxiety. Otherwise she remains undetectable with a slowly improving CD4 count   Review of Systems  Constitutional: Negative for fever, appetite change and fatigue.  HENT: Negative for sore throat and trouble swallowing.   Respiratory: Negative for cough and shortness of breath.   Cardiovascular: Negative for chest pain, palpitations and leg swelling.  Gastrointestinal: Negative for nausea, abdominal pain and diarrhea.  Neurological: Negative for dizziness and headaches.  Psychiatric/Behavioral: Positive for disturbed wake/sleep cycle and dysphoric mood. Negative for suicidal ideas. The patient is nervous/anxious.        Objective:   Physical Exam  Constitutional: She appears well-developed and well-nourished. No distress.  Cardiovascular: Normal rate, regular rhythm and normal heart sounds.  Exam reveals no gallop and no friction rub.   No murmur heard. Pulmonary/Chest: Effort normal and breath sounds normal. No respiratory distress. She has no wheezes. She has no rales.  Abdominal: Soft. Bowel sounds are normal. She exhibits no distension. There is no tenderness. There is no rebound.          Assessment & Plan:

## 2011-12-22 ENCOUNTER — Ambulatory Visit: Payer: Self-pay

## 2011-12-22 DIAGNOSIS — F331 Major depressive disorder, recurrent, moderate: Secondary | ICD-10-CM

## 2011-12-22 DIAGNOSIS — F411 Generalized anxiety disorder: Secondary | ICD-10-CM
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
## 2011-12-30 ENCOUNTER — Ambulatory Visit: Payer: Self-pay

## 2011-12-30 ENCOUNTER — Telehealth: Payer: Self-pay | Admitting: *Deleted

## 2011-12-30 DIAGNOSIS — F331 Major depressive disorder, recurrent, moderate: Secondary | ICD-10-CM

## 2011-12-30 DIAGNOSIS — F411 Generalized anxiety disorder: Secondary | ICD-10-CM

## 2011-12-30 NOTE — Telephone Encounter (Signed)
Patient's counselor Ariel Cox, stated he felt she would benefit from an antidepressant, however she is not willing to be seen by [facility].  He said it is a process and she probably would not be seen by a psychiatrist for a month.  Would Dr. Luciana Axe be willing to prescribe something for depression? Wendall Mola CMA

## 2011-12-30 NOTE — Progress Notes (Signed)
I met with Ariel Cox again today and she continues to report depressed mood and some anxiety.  We completed a treatment plan addressing both the depression and the anxiety.  We discussed psychiatric medication, but she said she did not want to go to [facility] or [facility] for med management.  She said that because of the depression it is difficult enough just to get here for her appointments with me.  I provided psycho-education on cognitive behavior therapy and gave her a list of Lawerance Bach' Cognitive Distortions.  I also provided more psycho-education on mindfulness meditation.  I also encouraged her to continue exercise and praised her for going for walks and attending a gym.  Plan to discuss the psychiatric medication options with her doctor and/or nurse here.  Plan to meet in one week.

## 2012-01-05 ENCOUNTER — Other Ambulatory Visit: Payer: Self-pay | Admitting: Internal Medicine

## 2012-01-05 MED ORDER — AMITRIPTYLINE HCL 100 MG PO TABS: 100.0000 mg | ORAL_TABLET | Freq: Every day | ORAL | Status: DC

## 2012-01-05 NOTE — Telephone Encounter (Signed)
Have her try amitriptyline 1 tab nightly (prescription sent) and stop the trazodone, which was prescribed for sleep.

## 2012-01-05 NOTE — Telephone Encounter (Signed)
Patient notified

## 2012-01-06 ENCOUNTER — Ambulatory Visit: Payer: Self-pay

## 2012-01-06 DIAGNOSIS — F331 Major depressive disorder, recurrent, moderate: Secondary | ICD-10-CM

## 2012-01-06 NOTE — Progress Notes (Signed)
I met with Ariel Cox again today and she talked some about her frustrations with looking for employment and her ongoing struggle with depression.  She did say that she started Elavil (newly prescribed by Dr. Luciana Axe) last night and it helped her sleep.  She also said she had a good conversation with a friend about mindfulness meditation and her friend mentioned a book she plans to loan her about this.  We discussed mindfulness and I provided more psycho-education on it, including mindful walking and using mindful awareness in mundane tasks such as washing dishes or doing laundry.  Plan to meet again in one week.

## 2012-01-13 ENCOUNTER — Ambulatory Visit: Payer: Self-pay

## 2012-03-19 ENCOUNTER — Other Ambulatory Visit: Payer: Self-pay | Admitting: Licensed Clinical Social Worker

## 2012-03-19 DIAGNOSIS — B20 Human immunodeficiency virus [HIV] disease: Secondary | ICD-10-CM

## 2012-03-19 MED ORDER — LAMIVUDINE-ZIDOVUDINE 150-300 MG PO TABS: 1.0000 | ORAL_TABLET | Freq: Two times a day (BID) | ORAL | Status: DC

## 2012-04-08 ENCOUNTER — Other Ambulatory Visit (INDEPENDENT_AMBULATORY_CARE_PROVIDER_SITE_OTHER): Payer: Self-pay

## 2012-04-08 DIAGNOSIS — B20 Human immunodeficiency virus [HIV] disease: Secondary | ICD-10-CM

## 2012-04-09 LAB — T-HELPER CELL (CD4) - (RCID CLINIC ONLY)
CD4 % Helper T Cell: 14 % — ABNORMAL LOW (ref 33–55)
CD4 T Cell Abs: 200 uL — ABNORMAL LOW (ref 400–2700)

## 2012-04-09 LAB — HIV-1 RNA QUANT-NO REFLEX-BLD
HIV 1 RNA Quant: <20 copies/mL (ref <20)
HIV-1 RNA Quant, Log: <1.3 {Log} (ref <1.30)

## 2012-04-12 ENCOUNTER — Other Ambulatory Visit: Payer: Self-pay | Admitting: *Deleted

## 2012-04-12 DIAGNOSIS — B20 Human immunodeficiency virus [HIV] disease: Secondary | ICD-10-CM

## 2012-04-12 MED ORDER — RITONAVIR 100 MG PO TABS: 100.0000 mg | ORAL_TABLET | Freq: Every day | ORAL | Status: DC

## 2012-04-12 MED ORDER — DARUNAVIR ETHANOLATE 800 MG PO TABS: 800.0000 mg | ORAL_TABLET | Freq: Every day | ORAL | Status: DC

## 2012-04-12 MED ORDER — RALTEGRAVIR POTASSIUM 400 MG PO TABS: 400.0000 mg | ORAL_TABLET | Freq: Two times a day (BID) | ORAL | Status: DC

## 2012-04-14 ENCOUNTER — Encounter: Payer: Self-pay | Admitting: *Deleted

## 2012-04-14 NOTE — Telephone Encounter (Signed)
Error, rx previously refilled

## 2012-04-22 ENCOUNTER — Ambulatory Visit (INDEPENDENT_AMBULATORY_CARE_PROVIDER_SITE_OTHER): Payer: Self-pay | Admitting: Internal Medicine

## 2012-04-22 ENCOUNTER — Encounter: Payer: Self-pay | Admitting: Internal Medicine

## 2012-04-22 VITALS — BP 128/84 | HR 94 | Temp 98.3°F | Ht 67.0 in | Wt 278.0 lb

## 2012-04-22 DIAGNOSIS — B20 Human immunodeficiency virus [HIV] disease: Secondary | ICD-10-CM

## 2012-04-22 DIAGNOSIS — I10 Essential (primary) hypertension: Secondary | ICD-10-CM

## 2012-04-22 NOTE — Assessment & Plan Note (Signed)
She continues to do well with this salvage regimen and will continue with the same. She has now been undetectable last few visits. Her CD4 count though remains at about 200 despite therapy. I will continue to follow this every 3-4 months.

## 2012-04-22 NOTE — Progress Notes (Signed)
  Subjective:    Patient ID: Ariel Cox, female    DOB: November 05, 1970, 42 y.o.   MRN: [id number]  HPI She was started on Complera in June 2012 but never followed up again. 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 She has a history of treatment of Kaletra and Combivir in the past. She was started on Combivir, boosted Prezista, Isentress. She is tolerating it well, no missed doses. She is pleased with the regimen.  She continues to be undetectable. No new issues. She is hoping to find a job and be able to get health insurance and continue seeing her primary physician once that is established.    Review of Systems  Constitutional: Negative for fever, chills and fatigue.  HENT: Negative for sore throat and trouble swallowing.   Respiratory: Negative for cough and shortness of breath.   Cardiovascular: Negative for leg swelling.  Gastrointestinal: Negative for nausea, abdominal pain and diarrhea.  Musculoskeletal: Negative for myalgias, joint swelling and arthralgias.  Skin: Negative for rash.  Neurological: Negative for dizziness and headaches.       Objective:   Physical Exam  Constitutional: She appears well-developed and well-nourished. No distress.  HENT:  Mouth/Throat: No oropharyngeal exudate.  Cardiovascular: Normal rate, regular rhythm and normal heart sounds.  Exam reveals no gallop and no friction rub.   No murmur heard. Pulmonary/Chest: Effort normal and breath sounds normal. No respiratory distress. She has no wheezes. She has no rales.  Abdominal: Soft. Bowel sounds are normal. She exhibits no distension. There is no tenderness.          Assessment & Plan:

## 2012-04-22 NOTE — Assessment & Plan Note (Signed)
Her blood pressure is okay today without medication. I continued to emphasize diet and lifestyle modifications.

## 2012-07-06 ENCOUNTER — Other Ambulatory Visit: Payer: Self-pay | Admitting: *Deleted

## 2012-07-06 DIAGNOSIS — G4700 Insomnia, unspecified: Secondary | ICD-10-CM

## 2012-07-06 MED ORDER — AMITRIPTYLINE HCL 100 MG PO TABS: 100.0000 mg | ORAL_TABLET | Freq: Every day | ORAL | Status: DC

## 2012-08-11 ENCOUNTER — Telehealth: Payer: Self-pay | Admitting: *Deleted

## 2012-08-11 NOTE — Telephone Encounter (Signed)
Pt has not been seeing Roderic Scarce, [facility] Counselor since she started this medication in November, 2013.  Pt had received 5 refills for this medication w/ original rx.  Was refilled 07/06/12 for 30 days without refill.  MD please advise about further refills, taper of medication or further follow-up with K. Shore, Veterinary surgeon.

## 2012-08-12 ENCOUNTER — Telehealth: Payer: Self-pay | Admitting: *Deleted

## 2012-08-12 DIAGNOSIS — G4700 Insomnia, unspecified: Secondary | ICD-10-CM

## 2012-08-12 MED ORDER — AMITRIPTYLINE HCL 100 MG PO TABS: 100.0000 mg | ORAL_TABLET | Freq: Every day | ORAL | Status: DC

## 2012-08-12 NOTE — Telephone Encounter (Signed)
Refill Oked by Dr. Luciana Axe.

## 2012-08-12 NOTE — Telephone Encounter (Signed)
Ok to refill, it is for sleep.

## 2012-08-17 ENCOUNTER — Other Ambulatory Visit: Payer: Self-pay

## 2012-08-17 ENCOUNTER — Other Ambulatory Visit (INDEPENDENT_AMBULATORY_CARE_PROVIDER_SITE_OTHER): Payer: Self-pay

## 2012-08-17 DIAGNOSIS — B20 Human immunodeficiency virus [HIV] disease: Secondary | ICD-10-CM

## 2012-08-18 LAB — HIV-1 RNA QUANT-NO REFLEX-BLD
HIV 1 RNA Quant: <20 copies/mL (ref <20)
HIV-1 RNA Quant, Log: <1.3 {Log} (ref <1.30)

## 2012-08-18 LAB — T-HELPER CELL (CD4) - (RCID CLINIC ONLY)
CD4 % Helper T Cell: 14 % — ABNORMAL LOW (ref 33–55)
CD4 T Cell Abs: 370 uL — ABNORMAL LOW (ref 400–2700)

## 2012-08-30 ENCOUNTER — Telehealth: Payer: Self-pay | Admitting: *Deleted

## 2012-08-30 NOTE — Telephone Encounter (Signed)
RN left message offering to do PAP smear when the pt comes in for MD visit on 09/06/12.  Pt could come at 3:30pm and have the PAP smear prior to MD visit.

## 2012-08-31 ENCOUNTER — Ambulatory Visit: Payer: Self-pay | Admitting: Internal Medicine

## 2012-09-06 ENCOUNTER — Encounter: Payer: Self-pay | Admitting: Internal Medicine

## 2012-09-06 ENCOUNTER — Ambulatory Visit (INDEPENDENT_AMBULATORY_CARE_PROVIDER_SITE_OTHER): Payer: Self-pay | Admitting: Internal Medicine

## 2012-09-06 VITALS — BP 144/92 | HR 84 | Temp 98.3°F | Ht 66.0 in | Wt 294.0 lb

## 2012-09-06 DIAGNOSIS — Z113 Encounter for screening for infections with a predominantly sexual mode of transmission: Secondary | ICD-10-CM

## 2012-09-06 DIAGNOSIS — Z79899 Other long term (current) drug therapy: Secondary | ICD-10-CM

## 2012-09-06 DIAGNOSIS — I10 Essential (primary) hypertension: Secondary | ICD-10-CM

## 2012-09-06 DIAGNOSIS — B20 Human immunodeficiency virus [HIV] disease: Secondary | ICD-10-CM

## 2012-09-06 MED ORDER — AMLODIPINE BESYLATE 5 MG PO TABS: 5.0000 mg | ORAL_TABLET | Freq: Every day | ORAL | Status: DC

## 2012-09-06 NOTE — Progress Notes (Signed)
  Subjective:    Patient ID: Ariel Cox, female    DOB: 12-05-1970, 42 y.o.   MRN: [id number]  HPI She was started on Complera in June 2012 but never followed up again. 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 She has a history of treatment of Kaletra and Combivir in the past. She was started on Combivir, boosted Prezista, Isentress. She is tolerating it well, no missed doses. She is pleased with the regimen.  She continues to be undetectable. No new issues. Blood pressure is again up. She has previously been on blood pressure medicine. No headaches, no weight loss or diarrhea. She is trying some lifestyle medications for her blood pressure however has not lost any weight.    Review of Systems  Constitutional: Negative for fever and fatigue.  HENT: Negative for sore throat and trouble swallowing.   Respiratory: Negative for cough and shortness of breath.   Cardiovascular: Negative for chest pain and leg swelling.  Gastrointestinal: Negative for nausea, abdominal pain and diarrhea.  Musculoskeletal: Negative for myalgias and arthralgias.  Skin: Negative for rash.  Neurological: Negative for dizziness, light-headedness and headaches.  Hematological: Negative for adenopathy.  Psychiatric/Behavioral: Negative for dysphoric mood.       Objective:   Physical Exam  Constitutional: She is oriented to person, place, and time. She appears well-developed and well-nourished. No distress.  HENT:  Mouth/Throat: Oropharynx is clear and moist. No oropharyngeal exudate.  Eyes: Right eye exhibits no discharge. Left eye exhibits no discharge. No scleral icterus.  Cardiovascular: Normal rate, regular rhythm and normal heart sounds.   No murmur heard. Pulmonary/Chest: Effort normal and breath sounds normal. No respiratory distress. She has no wheezes.  Lymphadenopathy:    She has no cervical adenopathy.   Neurological: She is alert and oriented to person, place, and time.  Skin: Skin is warm and dry. No rash noted.  Psychiatric: She has a normal mood and affect. Her behavior is normal.          Assessment & Plan:

## 2012-09-06 NOTE — Assessment & Plan Note (Signed)
I will start her on Norvasc and she will come in 2 weeks for followup blood pressure. I also discussed lifestyle modifications including low salt and exercise along with weight loss.

## 2012-09-06 NOTE — Assessment & Plan Note (Signed)
She is doing well with her medications despite a highly resistant organism. She will continue with every 3 month followup.

## 2012-11-07 ENCOUNTER — Other Ambulatory Visit: Payer: Self-pay | Admitting: Internal Medicine

## 2012-12-07 ENCOUNTER — Other Ambulatory Visit: Payer: Self-pay | Admitting: *Deleted

## 2012-12-07 ENCOUNTER — Other Ambulatory Visit (INDEPENDENT_AMBULATORY_CARE_PROVIDER_SITE_OTHER): Payer: Self-pay

## 2012-12-07 DIAGNOSIS — B20 Human immunodeficiency virus [HIV] disease: Secondary | ICD-10-CM

## 2012-12-07 DIAGNOSIS — Z113 Encounter for screening for infections with a predominantly sexual mode of transmission: Secondary | ICD-10-CM

## 2012-12-07 DIAGNOSIS — Z79899 Other long term (current) drug therapy: Secondary | ICD-10-CM

## 2012-12-07 LAB — CBC WITH DIFFERENTIAL/PLATELET
Basophils Absolute: 0 10*3/uL (ref 0.0–0.1)
Basophils Relative: 0 % (ref 0–1)
Eosinophils Absolute: 0.1 10*3/uL (ref 0.0–0.7)
Eosinophils Relative: 1 % (ref 0–5)
HCT: 31.7 % — ABNORMAL LOW (ref 36.0–46.0)
Hemoglobin: 11.1 g/dL — ABNORMAL LOW (ref 12.0–15.0)
Lymphocytes Relative: 47 % — ABNORMAL HIGH (ref 12–46)
Lymphs Abs: 2.6 10*3/uL (ref 0.7–4.0)
MCH: 31 pg (ref 26.0–34.0)
MCHC: 35 g/dL (ref 30.0–36.0)
MCV: 88.5 fL (ref 78.0–100.0)
Monocytes Absolute: 0.6 10*3/uL (ref 0.1–1.0)
Monocytes Relative: 11 % (ref 3–12)
Neutro Abs: 2.3 10*3/uL (ref 1.7–7.7)
Neutrophils Relative %: 41 % — ABNORMAL LOW (ref 43–77)
Platelets: 351 10*3/uL (ref 150–400)
RBC: 3.58 MIL/uL — ABNORMAL LOW (ref 3.87–5.11)
RDW: 14.7 % (ref 11.5–15.5)
WBC: 5.6 10*3/uL (ref 4.0–10.5)

## 2012-12-07 LAB — COMPLETE METABOLIC PANEL WITH GFR
ALT: 12 U/L (ref 0–35)
AST: 14 U/L (ref 0–37)
Albumin: 4 g/dL (ref 3.5–5.2)
Alkaline Phosphatase: 85 U/L (ref 39–117)
BUN: 9 mg/dL (ref 6–23)
CO2: 24 mEq/L (ref 19–32)
Calcium: 8.4 mg/dL (ref 8.4–10.5)
Chloride: 106 mEq/L (ref 96–112)
Creat: 1.04 mg/dL (ref 0.50–1.10)
GFR, Est African American: 76 mL/min
GFR, Est Non African American: 66 mL/min
Glucose, Bld: 87 mg/dL (ref 70–99)
Potassium: 4.6 mEq/L (ref 3.5–5.3)
Sodium: 138 mEq/L (ref 135–145)
Total Bilirubin: 0.3 mg/dL (ref 0.3–1.2)
Total Protein: 7.5 g/dL (ref 6.0–8.3)

## 2012-12-07 LAB — LIPID PANEL
Cholesterol: 238 mg/dL — ABNORMAL HIGH (ref 0–200)
HDL: 64 mg/dL (ref >39)
LDL Cholesterol: 153 mg/dL — ABNORMAL HIGH (ref 0–99)
Total CHOL/HDL Ratio: 3.7 Ratio
Triglycerides: 104 mg/dL (ref <150)
VLDL: 21 mg/dL (ref 0–40)

## 2012-12-07 MED ORDER — DARUNAVIR ETHANOLATE 800 MG PO TABS: ORAL_TABLET | ORAL | Status: DC

## 2012-12-07 MED ORDER — RALTEGRAVIR POTASSIUM 400 MG PO TABS: ORAL_TABLET | ORAL | Status: DC

## 2012-12-07 MED ORDER — LAMIVUDINE-ZIDOVUDINE 150-300 MG PO TABS: 1.0000 | ORAL_TABLET | Freq: Two times a day (BID) | ORAL | Status: DC

## 2012-12-07 MED ORDER — RITONAVIR 100 MG PO TABS: ORAL_TABLET | ORAL | Status: DC

## 2012-12-08 LAB — RPR

## 2012-12-09 LAB — HIV-1 RNA QUANT-NO REFLEX-BLD
HIV 1 RNA Quant: <20 copies/mL (ref <20)
HIV-1 RNA Quant, Log: <1.3 {Log} (ref <1.30)

## 2012-12-09 LAB — T-HELPER CELL (CD4) - (RCID CLINIC ONLY)
CD4 % Helper T Cell: 13 % — ABNORMAL LOW (ref 33–55)
CD4 T Cell Abs: 320 /uL — ABNORMAL LOW (ref 400–2700)

## 2012-12-23 ENCOUNTER — Encounter: Payer: Self-pay | Admitting: Internal Medicine

## 2012-12-23 ENCOUNTER — Ambulatory Visit (INDEPENDENT_AMBULATORY_CARE_PROVIDER_SITE_OTHER): Payer: Self-pay | Admitting: Internal Medicine

## 2012-12-23 ENCOUNTER — Other Ambulatory Visit: Payer: Self-pay

## 2012-12-23 VITALS — BP 124/84 | HR 94 | Temp 98.5°F | Ht 67.0 in | Wt 308.0 lb

## 2012-12-23 DIAGNOSIS — F329 Major depressive disorder, single episode, unspecified: Secondary | ICD-10-CM

## 2012-12-23 DIAGNOSIS — B20 Human immunodeficiency virus [HIV] disease: Secondary | ICD-10-CM

## 2012-12-23 DIAGNOSIS — E785 Hyperlipidemia, unspecified: Secondary | ICD-10-CM

## 2012-12-23 DIAGNOSIS — F3289 Other specified depressive episodes: Secondary | ICD-10-CM

## 2012-12-23 DIAGNOSIS — Z23 Encounter for immunization: Secondary | ICD-10-CM

## 2012-12-23 DIAGNOSIS — I10 Essential (primary) hypertension: Secondary | ICD-10-CM

## 2012-12-23 NOTE — Assessment & Plan Note (Signed)
She is doing well despite highly resistant virus. She'll return in 3 months

## 2012-12-23 NOTE — Assessment & Plan Note (Signed)
Good blood pressure now on Norvasc

## 2012-12-23 NOTE — Progress Notes (Signed)
  Subjective:    Patient ID: Ariel Cox, female    DOB: 1970-12-25, 42 y.o.   MRN: [id number]  HPI  She was started on Complera in June 2012 but never followed up again. 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 She has a history of treatment of Kaletra and Combivir in the past. She was started on Combivir, boosted Prezista, Isentress. She is tolerating it well, no missed doses. She is pleased with the regimen.  She continues to be undetectable. No new issues. Blood pressure proved on therapy. No headaches, no weight loss or diarrhea. She is trying some lifestyle medications for her blood pressure however has gained weight.    Review of Systems  Constitutional: Negative for fever and fatigue.  HENT: Negative for sore throat and trouble swallowing.   Respiratory: Negative for cough and shortness of breath.   Cardiovascular: Negative for chest pain and leg swelling.  Gastrointestinal: Negative for nausea, abdominal pain and diarrhea.  Musculoskeletal: Negative for arthralgias and myalgias.  Skin: Negative for rash.  Neurological: Negative for dizziness, light-headedness and headaches.  Hematological: Negative for adenopathy.  Psychiatric/Behavioral: Negative for dysphoric mood.       Objective:   Physical Exam  Constitutional: She is oriented to person, place, and time. She appears well-developed and well-nourished. No distress.  HENT:  Mouth/Throat: Oropharynx is clear and moist. No oropharyngeal exudate.  Eyes: Right eye exhibits no discharge. Left eye exhibits no discharge. No scleral icterus.  Cardiovascular: Normal rate, regular rhythm and normal heart sounds.   No murmur heard. Pulmonary/Chest: Effort normal and breath sounds normal. No respiratory distress. She has no wheezes.  Lymphadenopathy:    She has no cervical adenopathy.  Neurological: She is alert and oriented to  person, place, and time.  Skin: Skin is warm and dry. No rash noted.  Psychiatric: She has a normal mood and affect. Her behavior is normal.          Assessment & Plan:

## 2012-12-23 NOTE — Assessment & Plan Note (Signed)
She has had some weight gain to 2 depression and eating. She was offered reestablishing care with her counselor and she will call if she wants to do that

## 2012-12-23 NOTE — Assessment & Plan Note (Signed)
She is going to reestablish lifestyle modifications and this will be followed yearly she was not fasting for the labs

## 2012-12-24 ENCOUNTER — Telehealth: Payer: Self-pay | Admitting: *Deleted

## 2012-12-24 NOTE — Telephone Encounter (Signed)
Needing PAP smear.

## 2013-02-02 ENCOUNTER — Other Ambulatory Visit: Payer: Self-pay | Admitting: Internal Medicine

## 2013-02-02 DIAGNOSIS — F32A Depression, unspecified: Secondary | ICD-10-CM

## 2013-02-02 DIAGNOSIS — F329 Major depressive disorder, single episode, unspecified: Secondary | ICD-10-CM

## 2013-02-03 ENCOUNTER — Other Ambulatory Visit: Payer: Self-pay | Admitting: Internal Medicine

## 2013-02-15 ENCOUNTER — Telehealth: Payer: Self-pay | Admitting: *Deleted

## 2013-02-15 NOTE — Telephone Encounter (Signed)
Needing annual PAP smear.  Offered pt the opportunity to have PAP smear done when she comes for Lab Work on 03/24/13 or when she comes for visit with Dr. Luciana Axe.   Left [facility] ph number to call to add to appt schedule.

## 2013-02-28 ENCOUNTER — Other Ambulatory Visit: Payer: Self-pay | Admitting: Internal Medicine

## 2013-02-28 DIAGNOSIS — I10 Essential (primary) hypertension: Secondary | ICD-10-CM

## 2013-03-11 ENCOUNTER — Ambulatory Visit (INDEPENDENT_AMBULATORY_CARE_PROVIDER_SITE_OTHER): Payer: Self-pay | Admitting: *Deleted

## 2013-03-11 DIAGNOSIS — Z124 Encounter for screening for malignant neoplasm of cervix: Secondary | ICD-10-CM

## 2013-03-11 DIAGNOSIS — R87620 Atypical squamous cells of undetermined significance on cytologic smear of vagina (ASC-US): Secondary | ICD-10-CM

## 2013-03-11 DIAGNOSIS — Z1231 Encounter for screening mammogram for malignant neoplasm of breast: Secondary | ICD-10-CM

## 2013-03-11 NOTE — Progress Notes (Addendum)
  Subjective:     Ariel Cox is a 43 y.o. woman who comes in today for a  pap smear only.  Previous abnormal Pap smears: yes. Contraception:condoms.  Pt concerned about intermittent fecal incontinence.  Has been happening over the past year.  Rn advised the pt to keep a record of these incidents until she comes for her next OV w/ Dr. Luciana Axeomer.  Asked her to pay attention to her dietary intake prior to these incidents.  Needing mammogram.  Filling out scholarship application.  Objective:  LMP: 02/23/13.  There were no vitals taken for this visit. Pelvic Exam:  Pap smear obtained.   Assessment:    Screening pap smear.   Plan:    Follow up in one year, or as indicated by Pap results.  Pt given educational materials re: HIV and women, self-esteem, BSE, nutrition and diet management, PAP smears and partner safety.  PAP smear results = ASC-US, HPV detected.  Pt with previous abnormal PAP smear.  Refer to [facility].

## 2013-03-11 NOTE — Patient Instructions (Signed)
  Your results will be ready in about a week.  You may look them up on MyChart and I will send a letter later.  Thank you for coming to the Center for your care.  Angelique Blonderenise, RN

## 2013-03-24 ENCOUNTER — Other Ambulatory Visit (INDEPENDENT_AMBULATORY_CARE_PROVIDER_SITE_OTHER): Payer: Self-pay

## 2013-03-24 DIAGNOSIS — B20 Human immunodeficiency virus [HIV] disease: Secondary | ICD-10-CM

## 2013-03-25 LAB — T-HELPER CELL (CD4) - (RCID CLINIC ONLY)
CD4 % Helper T Cell: 13 % — ABNORMAL LOW (ref 33–55)
CD4 T Cell Abs: 370 /uL — ABNORMAL LOW (ref 400–2700)

## 2013-03-27 LAB — HIV-1 RNA QUANT-NO REFLEX-BLD
HIV 1 RNA Quant: <20 copies/mL (ref <20)
HIV-1 RNA Quant, Log: <1.3 {Log} (ref <1.30)

## 2013-04-01 ENCOUNTER — Encounter: Payer: Self-pay | Admitting: *Deleted

## 2013-04-01 NOTE — Addendum Note (Signed)
Addended by: Jennet MaduroESTRIDGE, Lanette Ell D on: 04/01/2013 09:33 AM   Modules accepted: Orders

## 2013-04-05 ENCOUNTER — Encounter: Payer: Self-pay | Admitting: Family Medicine

## 2013-04-07 ENCOUNTER — Ambulatory Visit (INDEPENDENT_AMBULATORY_CARE_PROVIDER_SITE_OTHER): Payer: Self-pay | Admitting: Internal Medicine

## 2013-04-07 ENCOUNTER — Encounter: Payer: Self-pay | Admitting: Internal Medicine

## 2013-04-07 VITALS — BP 140/89 | HR 96 | Temp 98.0°F | Ht 67.0 in | Wt 309.0 lb

## 2013-04-07 DIAGNOSIS — R609 Edema, unspecified: Secondary | ICD-10-CM | POA: Insufficient documentation

## 2013-04-07 DIAGNOSIS — E669 Obesity, unspecified: Secondary | ICD-10-CM

## 2013-04-07 DIAGNOSIS — I10 Essential (primary) hypertension: Secondary | ICD-10-CM

## 2013-04-07 DIAGNOSIS — R159 Full incontinence of feces: Secondary | ICD-10-CM | POA: Insufficient documentation

## 2013-04-07 DIAGNOSIS — B20 Human immunodeficiency virus [HIV] disease: Secondary | ICD-10-CM

## 2013-04-07 LAB — POCT URINE PREGNANCY: Preg Test, Ur: NEGATIVE

## 2013-04-07 LAB — BASIC METABOLIC PANEL WITH GFR
BUN: 10 mg/dL (ref 6–23)
CO2: 24 mEq/L (ref 19–32)
Calcium: 7 mg/dL — ABNORMAL LOW (ref 8.4–10.5)
Chloride: 101 mEq/L (ref 96–112)
Creat: 0.95 mg/dL (ref 0.50–1.10)
GFR, Est African American: 85 mL/min
GFR, Est Non African American: 74 mL/min
Glucose, Bld: 88 mg/dL (ref 70–99)
Potassium: 4.1 mEq/L (ref 3.5–5.3)
Sodium: 136 mEq/L (ref 135–145)

## 2013-04-07 MED ORDER — TRAZODONE HCL 100 MG PO TABS
100.0000 mg | ORAL_TABLET | Freq: Every day | ORAL | Status: DC
Start: 2013-04-07 — End: 2014-05-06

## 2013-04-07 NOTE — Assessment & Plan Note (Signed)
Discussed exercise and weight loss 

## 2013-04-07 NOTE — Assessment & Plan Note (Signed)
Unknown etiology.  I will start by trying to switch Elavil to trazodone to see if there is any change.  If not, will go back to Elavil.  Will consider GI input once she has insurance.

## 2013-04-07 NOTE — Assessment & Plan Note (Signed)
Discussed decreased salt and exercise.

## 2013-04-07 NOTE — Assessment & Plan Note (Signed)
Doing very well on her salvage regimen.  RTC 3 months.

## 2013-04-07 NOTE — Progress Notes (Signed)
  Subjective:    Patient ID: Ariel Cox, female    DOB: February 05, 1971, 43 y.o.   MRN: [id number]  HPI  She was started on Complera in June 2012 but never followed up again. 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 She has a history of treatment of Kaletra and Combivir in the past. She was started on Combivir, boosted Prezista, Isentress. She is tolerating it well, no missed doses. She is pleased with the regimen.  She continues to be undetectable. No headaches, no weight loss or diarrhea. She is trying some lifestyle medications for her blood pressure however has gained weight.  Also complaint of fecal incontinence about 1-2 times per month.  No tenesmus or other associated issues.    Sleep and depression ok on Elavil.     Review of Systems  Constitutional: Negative for fever and fatigue.  HENT: Negative for sore throat and trouble swallowing.   Respiratory: Negative for cough and shortness of breath.   Cardiovascular: Negative for chest pain and leg swelling.  Gastrointestinal: Negative for nausea, abdominal pain and diarrhea.  Musculoskeletal: Negative for arthralgias and myalgias.  Skin: Negative for rash.  Neurological: Negative for dizziness, light-headedness and headaches.  Hematological: Negative for adenopathy.  Psychiatric/Behavioral: Negative for dysphoric mood.       Objective:   Physical Exam  Constitutional: She is oriented to person, place, and time. She appears well-developed and well-nourished. No distress.  HENT:  Mouth/Throat: Oropharynx is clear and moist. No oropharyngeal exudate.  Eyes: Right eye exhibits no discharge. Left eye exhibits no discharge. No scleral icterus.  Cardiovascular: Normal rate, regular rhythm and normal heart sounds.   No murmur heard. Pulmonary/Chest: Effort normal and breath sounds normal. No respiratory distress. She has no wheezes.   Lymphadenopathy:    She has no cervical adenopathy.  Neurological: She is alert and oriented to person, place, and time.  Skin: Skin is warm and dry. No rash noted.  Psychiatric: She has a normal mood and affect. Her behavior is normal.          Assessment & Plan:

## 2013-04-07 NOTE — Addendum Note (Signed)
Addended by: Mariea ClontsGREEN, Apolinar Bero D on: 04/07/2013 04:58 PM   Modules accepted: Orders

## 2013-04-08 LAB — URINALYSIS, ROUTINE W REFLEX MICROSCOPIC
Bilirubin Urine: NEGATIVE
Glucose, UA: NEGATIVE mg/dL
Ketones, ur: NEGATIVE mg/dL
Leukocytes, UA: NEGATIVE
Nitrite: NEGATIVE
Protein, ur: NEGATIVE mg/dL
Specific Gravity, Urine: 1.009 (ref 1.005–1.030)
Urobilinogen, UA: 0.2 mg/dL (ref 0.0–1.0)
pH: 6.5 (ref 5.0–8.0)

## 2013-04-08 LAB — URINALYSIS, MICROSCOPIC ONLY
Casts: NONE SEEN
Crystals: NONE SEEN

## 2013-04-27 ENCOUNTER — Other Ambulatory Visit: Payer: Self-pay | Admitting: Internal Medicine

## 2013-04-27 DIAGNOSIS — Z1231 Encounter for screening mammogram for malignant neoplasm of breast: Secondary | ICD-10-CM

## 2013-05-03 ENCOUNTER — Ambulatory Visit (HOSPITAL_COMMUNITY)
Admission: RE | Admit: 2013-05-03 | Discharge: 2013-05-03 | Disposition: A | Discharge status: Home / Self Care | Payer: Self-pay | Source: Ambulatory Visit | Attending: Internal Medicine | Admitting: Internal Medicine

## 2013-05-03 DIAGNOSIS — Z1231 Encounter for screening mammogram for malignant neoplasm of breast: Secondary | ICD-10-CM

## 2013-05-04 ENCOUNTER — Encounter: Payer: Self-pay | Admitting: Family Medicine

## 2013-05-06 ENCOUNTER — Telehealth (HOSPITAL_COMMUNITY): Payer: Self-pay | Admitting: *Deleted

## 2013-05-06 NOTE — Telephone Encounter (Signed)
Telephoned patient at home # and left message to return call to [facility] 

## 2013-05-10 ENCOUNTER — Encounter: Payer: Self-pay | Admitting: *Deleted

## 2013-05-13 ENCOUNTER — Telehealth (HOSPITAL_COMMUNITY): Payer: Self-pay | Admitting: *Deleted

## 2013-05-13 NOTE — Telephone Encounter (Signed)
Telephoned patient at home # and left message to return call to [facility] 

## 2013-05-24 ENCOUNTER — Ambulatory Visit (HOSPITAL_COMMUNITY)
Admission: RE | Admit: 2013-05-24 | Discharge: 2013-05-24 | Disposition: A | Discharge status: Home / Self Care | Payer: Self-pay | Source: Ambulatory Visit | Attending: Obstetrics and Gynecology | Admitting: Obstetrics and Gynecology

## 2013-05-24 ENCOUNTER — Encounter (HOSPITAL_COMMUNITY): Payer: Self-pay

## 2013-05-24 VITALS — BP 134/80 | Temp 98.9°F | Ht 67.0 in | Wt 315.4 lb

## 2013-05-24 DIAGNOSIS — Z1239 Encounter for other screening for malignant neoplasm of breast: Secondary | ICD-10-CM

## 2013-05-24 DIAGNOSIS — R87810 Cervical high risk human papillomavirus (HPV) DNA test positive: Secondary | ICD-10-CM

## 2013-05-24 DIAGNOSIS — R87610 Atypical squamous cells of undetermined significance on cytologic smear of cervix (ASC-US): Secondary | ICD-10-CM | POA: Insufficient documentation

## 2013-05-24 HISTORY — DX: Essential (primary) hypertension: I10

## 2013-05-24 NOTE — Progress Notes (Signed)
Patient referred to [facility] due to abnormal Pap smear 03/11/2013 and a colposcopy recommended for follow-up.  Pap Smear:  Pap smear not completed today. Last Pap smear was 03/11/2013 and ASCUS HPV+. [facility] per recommendation for follow-up for abnormal Pap smear. Per patient has a history of an abnormal Pap smear 10 years ago that required a colposcopy for follow-up. Last Pap smear result is in EPIC.  Physical exam: Breasts Breasts symmetrical. No skin abnormalities bilateral breasts. No nipple retraction bilateral breasts. No nipple discharge bilateral breasts. No lymphadenopathy. No lumps palpated bilateral breasts. No complaints of pain or tenderness on exam. Patient had a mammogram 05/03/2013 that was negative. Screening mammogram recommended in 1 year unless clinically indicated.      Pelvic/Bimanual No Pap smear completed today since last Pap smear was 03/11/2013. Pap smear not indicated per [facility] guidelines.

## 2013-05-24 NOTE — Patient Instructions (Signed)
Taught Ariel FillersYvette R Cox how to perform BSE and gave educational materials to take home. Patient did not need a Pap smear today due to last Pap smear was 03/11/2013. [facility] per recommendation for follow-up for abnormal Pap smear. The Clinics will call patient with follow-up appointment. Patient aware of appointment and will be there. Let patient know will follow up with her within the next couple weeks with results. Ariel FillersYvette R Cox verbalized understanding.  Destin Kittler, Kathaleen Maserhristine Poll, RN 2:18 PM

## 2013-05-30 ENCOUNTER — Other Ambulatory Visit: Payer: Self-pay | Admitting: Internal Medicine

## 2013-05-31 ENCOUNTER — Other Ambulatory Visit: Payer: Self-pay | Admitting: *Deleted

## 2013-05-31 DIAGNOSIS — B20 Human immunodeficiency virus [HIV] disease: Secondary | ICD-10-CM

## 2013-05-31 MED ORDER — RITONAVIR 100 MG PO TABS: ORAL_TABLET | ORAL | Status: DC

## 2013-05-31 MED ORDER — LAMIVUDINE-ZIDOVUDINE 150-300 MG PO TABS: 1.0000 | ORAL_TABLET | Freq: Two times a day (BID) | ORAL | Status: DC

## 2013-05-31 MED ORDER — DARUNAVIR ETHANOLATE 800 MG PO TABS: ORAL_TABLET | ORAL | Status: DC

## 2013-05-31 MED ORDER — RALTEGRAVIR POTASSIUM 400 MG PO TABS: ORAL_TABLET | ORAL | Status: DC

## 2013-06-20 ENCOUNTER — Other Ambulatory Visit (HOSPITAL_COMMUNITY)
Admission: RE | Admit: 2013-06-20 | Discharge: 2013-06-20 | Disposition: A | Discharge status: Home / Self Care | Payer: Self-pay | Source: Ambulatory Visit | Attending: Family Medicine | Admitting: Family Medicine

## 2013-06-20 ENCOUNTER — Encounter: Payer: Self-pay | Admitting: Family Medicine

## 2013-06-20 ENCOUNTER — Ambulatory Visit (INDEPENDENT_AMBULATORY_CARE_PROVIDER_SITE_OTHER): Payer: Self-pay | Admitting: Family Medicine

## 2013-06-20 VITALS — BP 127/81 | HR 91 | Temp 97.5°F | Ht 67.0 in | Wt 311.3 lb

## 2013-06-20 DIAGNOSIS — R87810 Cervical high risk human papillomavirus (HPV) DNA test positive: Secondary | ICD-10-CM | POA: Insufficient documentation

## 2013-06-20 DIAGNOSIS — R87610 Atypical squamous cells of undetermined significance on cytologic smear of cervix (ASC-US): Secondary | ICD-10-CM

## 2013-06-20 DIAGNOSIS — Z01812 Encounter for preprocedural laboratory examination: Secondary | ICD-10-CM

## 2013-06-20 LAB — POCT PREGNANCY, URINE: Preg Test, Ur: NEGATIVE

## 2013-06-20 NOTE — Progress Notes (Signed)
Subjective: Pt. Is an 43 y.o. G1P1001 who presents for follow-up following an abnormal pap smear which showed ASCUS.  Objective: Filed Vitals:   06/20/13 1323  BP: 127/81  Pulse: 91  Temp: 97.5 F (36.4 C)   Procedure: Patient given informed consent, signed copy in the chart, time out was performed.  Placed in lithotomy position. Cervix viewed with speculum and colposcope after application of acetic acid.   Colposcopy adequate?  Yes  Acetowhite lesions?yes Punctation?no Mosaicism?  no Abnormal vasculature?  no Biopsies?5 and 7 o'clock ECC?yes    Assessment: Atypical squamous cell changes of undetermined significance (ASCUS) on cervical cytology with positive high risk human papilloma virus (HPV) - Plan: Surgical pathology  Pre-procedure lab exam  Colposcopy completed Patient was given post procedure instructions.  She will be called with results

## 2013-06-20 NOTE — Patient Instructions (Signed)
Colposcopy  Colposcopy is a procedure to examine your cervix and vagina, or the area around the outside of your vagina, for abnormalities or signs of disease. The procedure is done using a lighted microscope called a colposcope. Tissue samples may be collected during the colposcopy if your health care provider finds any unusual cells. A colposcopy may be done if a woman has:   An abnormal Pap test. A Pap test is a medical test done to evaluate cells that are on the surface of the cervix.   A Pap test result that is suggestive of human papillomavirus (HPV). This virus can cause genital warts and is linked to the development of cervical cancer.   A sore on her cervix and the results of a Pap test were normal.   Genital warts on the cervix or in or around the outside of the vagina.   A mother who took the drug diethylstilbestrol (DES) while pregnant.   Painful intercourse.   Vaginal bleeding, especially after sexual intercourse.  LET YOUR HEALTH CARE PROVIDER KNOW ABOUT:   Any allergies you have.   All medicines you are taking, including vitamins, herbs, eye drops, creams, and over-the-counter medicines.   Previous problems you or members of your family have had with the use of anesthetics.   Any blood disorders you have.   Previous surgeries you have had.   Medical conditions you have.  RISKS AND COMPLICATIONS  Generally, a colposcopy is a safe procedure. However, as with any procedure, complications can occur. Possible complications include:   Bleeding.   Infection.   Missed lesions.  BEFORE THE PROCEDURE    Tell your health care provider if you have your menstrual period. A colposcopy typically is not done during menstruation.   For 24 hours before the colposcopy, do not:   Douche.   Use tampons.   Use medicines, creams, or suppositories in the vagina.   Have sexual intercourse.  PROCEDURE   During the procedure, you will be lying on your back with your feet in foot rests (stirrups). A warm  metal or plastic instrument (speculum) will be placed in your vagina to keep it open and to allow the health care provider to see the cervix. The colposcope will be placed outside the vagina. It will be used to magnify and examine the cervix, vagina, and the area around the outside of the vagina. A small amount of liquid solution will be placed on the area that is to be viewed. This solution will make it easier to see the abnormal cells. Your health care provider will use tools to suck out mucus and cells from the canal of the cervix. Then he or she will record the location of the abnormal areas.  If a biopsy is done during the procedure, a medicine will usually be given to numb the area (local anesthetic). You may feel mild pain or cramping while the biopsy is done. After the procedure, tissue samples collected during the biopsy will be sent to a lab for analysis.  AFTER THE PROCEDURE   You will be given instructions on when to follow up with your health care provider for your test results. It is important to keep your appointment.  Document Released: 04/26/2002 Document Revised: 10/06/2012 Document Reviewed: 09/02/2012  [employer] Patient Information 2014 [employer].

## 2013-06-21 ENCOUNTER — Encounter: Payer: Self-pay | Admitting: Family Medicine

## 2013-06-27 ENCOUNTER — Telehealth: Payer: Self-pay

## 2013-06-27 NOTE — Telephone Encounter (Signed)
Per Dr. Shawnie PonsPratt pt., based on colpo results, pt. Should follow up with repeat pap in 1 year. Attempted to call pt. No answer. Left message stating we are calling with results, please call clinic.

## 2013-06-28 ENCOUNTER — Other Ambulatory Visit: Payer: Self-pay | Admitting: Internal Medicine

## 2013-06-28 NOTE — Telephone Encounter (Signed)
Called pt. And informed of results and recomendation. Advised pt. To call two months in advance to schedule appointment for pap with HPV testing. Pt. Verbalized understanding and gratitude. No further questions or concerns.

## 2013-08-01 ENCOUNTER — Other Ambulatory Visit: Payer: Self-pay | Admitting: Internal Medicine

## 2013-09-02 ENCOUNTER — Other Ambulatory Visit: Payer: Self-pay | Admitting: Internal Medicine

## 2013-09-23 ENCOUNTER — Other Ambulatory Visit: Payer: Self-pay | Admitting: Internal Medicine

## 2013-10-02 ENCOUNTER — Other Ambulatory Visit: Payer: Self-pay | Admitting: Internal Medicine

## 2013-10-11 ENCOUNTER — Encounter: Payer: Self-pay | Admitting: General Practice

## 2013-10-30 ENCOUNTER — Other Ambulatory Visit: Payer: Self-pay | Admitting: Internal Medicine

## 2013-10-31 ENCOUNTER — Telehealth: Payer: Self-pay | Admitting: *Deleted

## 2013-10-31 NOTE — Telephone Encounter (Signed)
Rx request for Elavil denied and voice mail left for patient to call the clinic to schedule a follow up. Per last office note she was switched to trazodone and was told if this was not effective could go back to Elavil. However she is overdue for an appointment and need to find out if she has been taking the trazodone. Wendall Mola

## 2013-11-01 ENCOUNTER — Other Ambulatory Visit: Payer: Self-pay | Admitting: Internal Medicine

## 2013-11-29 ENCOUNTER — Other Ambulatory Visit (INDEPENDENT_AMBULATORY_CARE_PROVIDER_SITE_OTHER): Payer: Self-pay

## 2013-11-29 DIAGNOSIS — B20 Human immunodeficiency virus [HIV] disease: Secondary | ICD-10-CM

## 2013-11-29 DIAGNOSIS — Z113 Encounter for screening for infections with a predominantly sexual mode of transmission: Secondary | ICD-10-CM

## 2013-11-29 DIAGNOSIS — Z79899 Other long term (current) drug therapy: Secondary | ICD-10-CM

## 2013-11-29 LAB — CBC WITH DIFFERENTIAL/PLATELET
Basophils Absolute: 0 10*3/uL (ref 0.0–0.1)
Basophils Relative: 0 % (ref 0–1)
Eosinophils Absolute: 0.1 10*3/uL (ref 0.0–0.7)
Eosinophils Relative: 2 % (ref 0–5)
HCT: 32 % — ABNORMAL LOW (ref 36.0–46.0)
Hemoglobin: 10.6 g/dL — ABNORMAL LOW (ref 12.0–15.0)
Lymphocytes Relative: 44 % (ref 12–46)
Lymphs Abs: 2.3 10*3/uL (ref 0.7–4.0)
MCH: 29.9 pg (ref 26.0–34.0)
MCHC: 33.1 g/dL (ref 30.0–36.0)
MCV: 90.4 fL (ref 78.0–100.0)
Monocytes Absolute: 0.5 10*3/uL (ref 0.1–1.0)
Monocytes Relative: 10 % (ref 3–12)
Neutro Abs: 2.3 10*3/uL (ref 1.7–7.7)
Neutrophils Relative %: 44 % (ref 43–77)
Platelets: 324 10*3/uL (ref 150–400)
RBC: 3.54 MIL/uL — ABNORMAL LOW (ref 3.87–5.11)
RDW: 14.7 % (ref 11.5–15.5)
WBC: 5.2 10*3/uL (ref 4.0–10.5)

## 2013-11-30 LAB — COMPLETE METABOLIC PANEL WITH GFR
ALT: 11 U/L (ref 0–35)
AST: 14 U/L (ref 0–37)
Albumin: 3.6 g/dL (ref 3.5–5.2)
Alkaline Phosphatase: 73 U/L (ref 39–117)
BUN: 8 mg/dL (ref 6–23)
CO2: 21 mEq/L (ref 19–32)
Calcium: 8.4 mg/dL (ref 8.4–10.5)
Chloride: 107 mEq/L (ref 96–112)
Creat: 1 mg/dL (ref 0.50–1.10)
GFR, Est African American: 79 mL/min
GFR, Est Non African American: 69 mL/min
Glucose, Bld: 135 mg/dL — ABNORMAL HIGH (ref 70–99)
Potassium: 4.2 mEq/L (ref 3.5–5.3)
Sodium: 136 mEq/L (ref 135–145)
Total Bilirubin: 0.3 mg/dL (ref 0.2–1.2)
Total Protein: 6.6 g/dL (ref 6.0–8.3)

## 2013-11-30 LAB — LIPID PANEL
Cholesterol: 195 mg/dL (ref 0–200)
HDL: 47 mg/dL (ref >39)
LDL Cholesterol: 123 mg/dL — ABNORMAL HIGH (ref 0–99)
Total CHOL/HDL Ratio: 4.1 Ratio
Triglycerides: 123 mg/dL (ref <150)
VLDL: 25 mg/dL (ref 0–40)

## 2013-11-30 LAB — RPR

## 2013-11-30 LAB — HIV-1 RNA QUANT-NO REFLEX-BLD
HIV 1 RNA Quant: <20 copies/mL (ref <20)
HIV-1 RNA Quant, Log: <1.3 {Log} (ref <1.30)

## 2013-11-30 LAB — T-HELPER CELL (CD4) - (RCID CLINIC ONLY)
CD4 % Helper T Cell: 18 % — ABNORMAL LOW (ref 33–55)
CD4 T Cell Abs: 450 /uL (ref 400–2700)

## 2013-12-13 ENCOUNTER — Encounter: Payer: Self-pay | Admitting: Internal Medicine

## 2013-12-13 ENCOUNTER — Ambulatory Visit (INDEPENDENT_AMBULATORY_CARE_PROVIDER_SITE_OTHER): Payer: Self-pay | Admitting: Internal Medicine

## 2013-12-13 VITALS — BP 138/97 | HR 87 | Temp 98.1°F | Wt 311.0 lb

## 2013-12-13 DIAGNOSIS — F329 Major depressive disorder, single episode, unspecified: Secondary | ICD-10-CM

## 2013-12-13 DIAGNOSIS — F32A Depression, unspecified: Secondary | ICD-10-CM

## 2013-12-13 DIAGNOSIS — R159 Full incontinence of feces: Secondary | ICD-10-CM

## 2013-12-13 DIAGNOSIS — Z23 Encounter for immunization: Secondary | ICD-10-CM

## 2013-12-13 DIAGNOSIS — B20 Human immunodeficiency virus [HIV] disease: Secondary | ICD-10-CM

## 2013-12-13 DIAGNOSIS — E669 Obesity, unspecified: Secondary | ICD-10-CM

## 2013-12-13 NOTE — Assessment & Plan Note (Signed)
Doing well, rtc 4 months. 

## 2013-12-13 NOTE — Progress Notes (Signed)
  Subjective:    Patient ID: Ariel Cox, female    DOB: 07/04/1970, 43 y.o.   MRN: [id number]  HPI She was started on Complera in June 2012 but never followed up again. 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 She has a history of treatment of Kaletra and Combivir in the past. She was started on Combivir, boosted Prezista, Isentress. She is tolerating it well, no missed doses. She is pleased with the regimen.  She continues to be undetectable. No headaches, no weight loss or diarrhea. She has reduced her soda intake.   Continues to have fecal incontinence about 1 time per month.  No tenesmus or other associated issues.    Sleep and depression ok on trazodone    Review of Systems  Constitutional: Negative for fever and fatigue.  HENT: Negative for sore throat and trouble swallowing.   Respiratory: Negative for cough and shortness of breath.   Cardiovascular: Negative for chest pain and leg swelling.  Gastrointestinal: Negative for nausea, abdominal pain and diarrhea.  Musculoskeletal: Negative for arthralgias and myalgias.  Skin: Negative for rash.  Neurological: Negative for dizziness, light-headedness and headaches.  Hematological: Negative for adenopathy.  Psychiatric/Behavioral: Negative for dysphoric mood.       Objective:   Physical Exam  Constitutional: She is oriented to person, place, and time. She appears well-developed and well-nourished. No distress.  HENT:  Mouth/Throat: Oropharynx is clear and moist. No oropharyngeal exudate.  Eyes: Right eye exhibits no discharge. Left eye exhibits no discharge. No scleral icterus.  Cardiovascular: Normal rate, regular rhythm and normal heart sounds.   No murmur heard. Pulmonary/Chest: Effort normal and breath sounds normal. No respiratory distress. She has no wheezes.  Lymphadenopathy:    She has no cervical adenopathy.   Neurological: She is alert and oriented to person, place, and time.  Skin: Skin is warm and dry. No rash noted.  Psychiatric: She has a normal mood and affect. Her behavior is normal.          Assessment & Plan:

## 2013-12-13 NOTE — Assessment & Plan Note (Signed)
Cutting down on soda.

## 2013-12-13 NOTE — Assessment & Plan Note (Signed)
Improved with diet modifications.  Will need pcp if persists and further evaluation needed.

## 2013-12-13 NOTE — Assessment & Plan Note (Signed)
Doing well now with sleep medicine.  Will continue with trazodone

## 2013-12-19 ENCOUNTER — Encounter: Payer: Self-pay | Admitting: Internal Medicine

## 2014-01-11 ENCOUNTER — Other Ambulatory Visit: Payer: Self-pay | Admitting: Infectious Diseases

## 2014-01-11 ENCOUNTER — Other Ambulatory Visit: Payer: Self-pay | Admitting: *Deleted

## 2014-01-11 DIAGNOSIS — B20 Human immunodeficiency virus [HIV] disease: Secondary | ICD-10-CM

## 2014-01-11 MED ORDER — LAMIVUDINE-ZIDOVUDINE 150-300 MG PO TABS: 1.0000 | ORAL_TABLET | Freq: Two times a day (BID) | ORAL | Status: DC

## 2014-01-11 MED ORDER — RALTEGRAVIR POTASSIUM 400 MG PO TABS: ORAL_TABLET | ORAL | Status: DC

## 2014-01-11 MED ORDER — RITONAVIR 100 MG PO TABS: ORAL_TABLET | ORAL | Status: DC

## 2014-01-11 MED ORDER — DARUNAVIR ETHANOLATE 800 MG PO TABS: ORAL_TABLET | ORAL | Status: DC

## 2014-02-04 ENCOUNTER — Other Ambulatory Visit: Payer: Self-pay | Admitting: Internal Medicine

## 2014-03-19 ENCOUNTER — Emergency Department (HOSPITAL_COMMUNITY): Payer: Medicaid Other

## 2014-03-19 ENCOUNTER — Encounter (HOSPITAL_COMMUNITY): Payer: Self-pay | Admitting: Emergency Medicine

## 2014-03-19 ENCOUNTER — Inpatient Hospital Stay (HOSPITAL_COMMUNITY)
Admission: EM | Admit: 2014-03-19 | Discharge: 2014-03-21 | DRG: 757 | Disposition: A | Discharge status: Home / Self Care | Payer: Medicaid Other | Attending: Obstetrics & Gynecology | Admitting: Obstetrics & Gynecology

## 2014-03-19 DIAGNOSIS — R102 Pelvic and perineal pain: Secondary | ICD-10-CM

## 2014-03-19 DIAGNOSIS — Z79899 Other long term (current) drug therapy: Secondary | ICD-10-CM | POA: Diagnosis not present

## 2014-03-19 DIAGNOSIS — R1031 Right lower quadrant pain: Secondary | ICD-10-CM

## 2014-03-19 DIAGNOSIS — B20 Human immunodeficiency virus [HIV] disease: Secondary | ICD-10-CM | POA: Diagnosis present

## 2014-03-19 DIAGNOSIS — Z91013 Allergy to seafood: Secondary | ICD-10-CM

## 2014-03-19 DIAGNOSIS — N739 Female pelvic inflammatory disease, unspecified: Principal | ICD-10-CM | POA: Diagnosis present

## 2014-03-19 DIAGNOSIS — I10 Essential (primary) hypertension: Secondary | ICD-10-CM | POA: Diagnosis present

## 2014-03-19 DIAGNOSIS — F329 Major depressive disorder, single episode, unspecified: Secondary | ICD-10-CM | POA: Diagnosis not present

## 2014-03-19 DIAGNOSIS — N898 Other specified noninflammatory disorders of vagina: Secondary | ICD-10-CM

## 2014-03-19 DIAGNOSIS — N730 Acute parametritis and pelvic cellulitis: Secondary | ICD-10-CM

## 2014-03-19 DIAGNOSIS — Z9884 Bariatric surgery status: Secondary | ICD-10-CM

## 2014-03-19 LAB — URINALYSIS, ROUTINE W REFLEX MICROSCOPIC
Bilirubin Urine: NEGATIVE
Glucose, UA: NEGATIVE mg/dL
Ketones, ur: NEGATIVE mg/dL
Nitrite: POSITIVE — AB
Protein, ur: 30 mg/dL — AB
Specific Gravity, Urine: 1.019 (ref 1.005–1.030)
Urobilinogen, UA: 1 mg/dL (ref 0.0–1.0)
pH: 6 (ref 5.0–8.0)

## 2014-03-19 LAB — URINE MICROSCOPIC-ADD ON

## 2014-03-19 LAB — COMPREHENSIVE METABOLIC PANEL
ALT: 14 U/L (ref 0–35)
AST: 18 U/L (ref 0–37)
Albumin: 3.5 g/dL (ref 3.5–5.2)
Alkaline Phosphatase: 83 U/L (ref 39–117)
Anion gap: 7 (ref 5–15)
BUN: 11 mg/dL (ref 6–23)
CO2: 23 mmol/L (ref 19–32)
Calcium: 7.9 mg/dL — ABNORMAL LOW (ref 8.4–10.5)
Chloride: 107 mmol/L (ref 96–112)
Creatinine, Ser: 0.99 mg/dL (ref 0.50–1.10)
GFR calc Af Amer: 79 mL/min — ABNORMAL LOW (ref >90)
GFR calc non Af Amer: 68 mL/min — ABNORMAL LOW (ref >90)
Glucose, Bld: 123 mg/dL — ABNORMAL HIGH (ref 70–99)
Potassium: 3.2 mmol/L — ABNORMAL LOW (ref 3.5–5.1)
Sodium: 137 mmol/L (ref 135–145)
Total Bilirubin: 0.7 mg/dL (ref 0.3–1.2)
Total Protein: 7.2 g/dL (ref 6.0–8.3)

## 2014-03-19 LAB — CBC WITH DIFFERENTIAL/PLATELET
Basophils Absolute: 0 10*3/uL (ref 0.0–0.1)
Basophils Relative: 0 % (ref 0–1)
Eosinophils Absolute: 0 10*3/uL (ref 0.0–0.7)
Eosinophils Relative: 0 % (ref 0–5)
HCT: 32.1 % — ABNORMAL LOW (ref 36.0–46.0)
Hemoglobin: 10.3 g/dL — ABNORMAL LOW (ref 12.0–15.0)
Lymphocytes Relative: 11 % — ABNORMAL LOW (ref 12–46)
Lymphs Abs: 1.4 10*3/uL (ref 0.7–4.0)
MCH: 29.7 pg (ref 26.0–34.0)
MCHC: 32.1 g/dL (ref 30.0–36.0)
MCV: 92.5 fL (ref 78.0–100.0)
Monocytes Absolute: 0.7 10*3/uL (ref 0.1–1.0)
Monocytes Relative: 6 % (ref 3–12)
Neutro Abs: 11.1 10*3/uL — ABNORMAL HIGH (ref 1.7–7.7)
Neutrophils Relative %: 83 % — ABNORMAL HIGH (ref 43–77)
Platelets: 314 10*3/uL (ref 150–400)
RBC: 3.47 MIL/uL — ABNORMAL LOW (ref 3.87–5.11)
RDW: 14.4 % (ref 11.5–15.5)
WBC: 13.2 10*3/uL — ABNORMAL HIGH (ref 4.0–10.5)

## 2014-03-19 LAB — I-STAT BETA HCG BLOOD, ED (MC, WL, AP ONLY): I-stat hCG, quantitative: <5 m[IU]/mL (ref <5)

## 2014-03-19 LAB — WET PREP, GENITAL
Trich, Wet Prep: NONE SEEN
Yeast Wet Prep HPF POC: NONE SEEN

## 2014-03-19 LAB — PREGNANCY, URINE: Preg Test, Ur: NEGATIVE

## 2014-03-19 LAB — LIPASE, BLOOD: Lipase: 18 U/L (ref 11–59)

## 2014-03-19 MED ORDER — ONDANSETRON HCL 4 MG/2ML IJ SOLN
4.0000 mg | Freq: Once | INTRAMUSCULAR | Status: AC
  Administered 2014-03-19: 4 mg via INTRAVENOUS
  Filled 2014-03-19: qty 2

## 2014-03-19 MED ORDER — SODIUM CHLORIDE 0.9 % IV SOLN
1000.0000 mL | INTRAVENOUS | Status: DC
  Administered 2014-03-19: 1000 mL via INTRAVENOUS

## 2014-03-19 MED ORDER — HYDROMORPHONE HCL 1 MG/ML IJ SOLN
1.0000 mg | Freq: Once | INTRAMUSCULAR | Status: AC
  Administered 2014-03-20: 1 mg via INTRAVENOUS
  Filled 2014-03-19: qty 1

## 2014-03-19 MED ORDER — FENTANYL CITRATE 0.05 MG/ML IJ SOLN
50.0000 ug | Freq: Once | INTRAMUSCULAR | Status: DC
  Filled 2014-03-19: qty 2

## 2014-03-19 MED ORDER — ACETAMINOPHEN 325 MG PO TABS
650.0000 mg | ORAL_TABLET | Freq: Once | ORAL | Status: AC
  Administered 2014-03-19: 650 mg via ORAL
  Filled 2014-03-19: qty 2

## 2014-03-19 MED ORDER — IOHEXOL 300 MG/ML  SOLN
100.0000 mL | Freq: Once | INTRAMUSCULAR | Status: AC | PRN
Start: 2014-03-19 — End: 2014-03-19
  Administered 2014-03-19: 100 mL via INTRAVENOUS

## 2014-03-19 MED ORDER — SODIUM CHLORIDE 0.9 % IV SOLN
1000.0000 mL | Freq: Once | INTRAVENOUS | Status: AC
  Administered 2014-03-19: 1000 mL via INTRAVENOUS

## 2014-03-19 MED ORDER — DEXTROSE 5 % IV SOLN
1.0000 g | Freq: Once | INTRAVENOUS | Status: AC
  Administered 2014-03-19: 1 g via INTRAVENOUS
  Filled 2014-03-19: qty 10

## 2014-03-19 MED ORDER — HYDROMORPHONE HCL 1 MG/ML IJ SOLN
1.0000 mg | INTRAMUSCULAR | Status: AC | PRN
  Administered 2014-03-19 (×3): 1 mg via INTRAVENOUS
  Filled 2014-03-19 (×3): qty 1

## 2014-03-19 MED ORDER — DOXYCYCLINE HYCLATE 100 MG PO TABS
100.0000 mg | ORAL_TABLET | Freq: Once | ORAL | Status: AC
  Administered 2014-03-19: 100 mg via ORAL
  Filled 2014-03-19: qty 1

## 2014-03-19 MED ORDER — IOHEXOL 300 MG/ML  SOLN
50.0000 mL | Freq: Once | INTRAMUSCULAR | Status: AC | PRN
  Administered 2014-03-19: 50 mL via ORAL

## 2014-03-19 NOTE — ED Notes (Signed)
Pt unable to void at this time. 

## 2014-03-19 NOTE — ED Provider Notes (Signed)
CSN: [id number]     Arrival date & time 03/19/14  1445 History   First MD Initiated Contact with Patient 03/19/14 1603     Chief Complaint  Patient presents with  . Abdominal Pain  . Chills    Patient is a 44 y.o. female presenting with abdominal pain. The history is provided by the patient.  Abdominal Pain Pain location:  RLQ Pain quality: sharp   Pain radiates to:  Back Pain severity:  Severe Onset quality:  Gradual Duration:  1 day Timing:  Constant Progression:  Worsening Relieved by:  Nothing Worsened by:  Palpation Associated symptoms: anorexia, fever (up to 101) and nausea   Associated symptoms: no cough, no dysuria, no vaginal bleeding, no vaginal discharge and no vomiting     Past Medical History  Diagnosis Date  . HIV infection   . Depression   . Hypertension    Past Surgical History  Procedure Laterality Date  . Gastric bypass  2009   Family History  Problem Relation Age of Onset  . Breast cancer Maternal Aunt   . Diabetes Maternal Grandmother   . Cancer Paternal Grandmother     Pancreatic  . Cancer Maternal Grandfather     lung   History  Substance Use Topics  . Smoking status: Never Smoker   . Smokeless tobacco: Never Used  . Alcohol Use: Yes     Comment: rare   OB History    Gravida Para Term Preterm AB TAB SAB Ectopic Multiple Living   Review of Systems  Constitutional: Positive for fever (up to 101).  Respiratory: Negative for cough.   Gastrointestinal: Positive for nausea, abdominal pain and anorexia. Negative for vomiting.  Genitourinary: Negative for dysuria, vaginal bleeding and vaginal discharge.  All other systems reviewed and are negative.     Allergies  Shellfish allergy  Home Medications   Prior to Admission medications   Medication Sig Start Date End Date Taking? Authorizing Provider  Acetaminophen-DM (COLD & COUGH DAYTIME PO) Take 30 mLs by mouth 2 (two) times daily as needed (cold symptoms).   Yes  Historical Provider, MD  amLODipine (NORVASC) 5 MG tablet TAKE 1 TABLET BY MOUTH EVERY DAY 02/06/14  Yes Gardiner Barefoot, MD  Darunavir Ethanolate (PREZISTA) 800 MG tablet TAKE 1 TABLET BY MOUTH DAILY 01/11/14  Yes Gardiner Barefoot, MD  lamiVUDine-zidovudine (COMBIVIR) 150-300 MG per tablet Take 1 tablet by mouth 2 (two) times daily. 01/11/14 01/11/15 Yes Gardiner Barefoot, MD  raltegravir (ISENTRESS) 400 MG tablet TAKE 1 TABLET BY MOUTH TWICE DAILY 01/11/14  Yes Gardiner Barefoot, MD  ritonavir (NORVIR) 100 MG TABS tablet TAKE 1 TABLET BY MOUTH DAILY WITH BREAKFAST 01/11/14  Yes Gardiner Barefoot, MD  traZODone (DESYREL) 100 MG tablet Take 1 tablet (100 mg total) by mouth at bedtime. 04/07/13  Yes Gardiner Barefoot, MD   BP 116/65 mmHg  Pulse 101  Temp(Src) 101.7 F (38.7 C) (Oral)  Resp 20  SpO2 95%  LMP 03/13/2014 (Exact Date) Physical Exam  Constitutional: She appears well-developed. She appears distressed.  Obese   HENT:  Head: Normocephalic and atraumatic.  Right Ear: External ear normal.  Left Ear: External ear normal.  Eyes: Conjunctivae are normal. Right eye exhibits no discharge. Left eye exhibits no discharge. No scleral icterus.  Neck: Neck supple. No tracheal deviation present.  Cardiovascular: Normal rate, regular rhythm and intact distal pulses.  Pulmonary/Chest: Effort normal and breath sounds normal. No stridor. No respiratory distress. She has no wheezes. She has no rales.  Abdominal: Soft. Bowel sounds are normal. She exhibits no distension. There is tenderness in the right upper quadrant, right lower quadrant and epigastric area. There is guarding. There is no rigidity and no rebound. No hernia.  Genitourinary: Uterus is tender. Cervix exhibits motion tenderness, discharge and friability. Right adnexum displays tenderness. Right adnexum displays no mass. Left adnexum displays no mass. There is tenderness in the vagina. No bleeding in the vagina. No foreign body around the vagina.  Vaginal discharge found.  Yellow purulent vaginal discharge  Musculoskeletal: She exhibits no edema or tenderness.  Neurological: She is alert. She has normal strength. No cranial nerve deficit (no facial droop, extraocular movements intact, no slurred speech) or sensory deficit. She exhibits normal muscle tone. She displays no seizure activity. Coordination normal.  Skin: Skin is warm and dry. No rash noted.  Psychiatric: She has a normal mood and affect.  Nursing note and vitals reviewed.   ED Course  Procedures (including critical care time) Labs Review Labs Reviewed  WET PREP, GENITAL - Abnormal; Notable for the following:    Clue Cells Wet Prep HPF POC FEW (*)    WBC, Wet Prep HPF POC FEW (*)    All other components within normal limits  CBC WITH DIFFERENTIAL/PLATELET - Abnormal; Notable for the following:    WBC 13.2 (*)    RBC 3.47 (*)    Hemoglobin 10.3 (*)    HCT 32.1 (*)    Neutrophils Relative % 83 (*)    Neutro Abs 11.1 (*)    Lymphocytes Relative 11 (*)    All other components within normal limits  COMPREHENSIVE METABOLIC PANEL - Abnormal; Notable for the following:    Potassium 3.2 (*)    Glucose, Bld 123 (*)    Calcium 7.9 (*)    GFR calc non Af Amer 68 (*)    GFR calc Af Amer 79 (*)    All other components within normal limits  URINALYSIS, ROUTINE W REFLEX MICROSCOPIC - Abnormal; Notable for the following:    APPearance CLOUDY (*)    Hgb urine dipstick MODERATE (*)    Protein, ur 30 (*)    Nitrite POSITIVE (*)    Leukocytes, UA SMALL (*)    All other components within normal limits  URINE MICROSCOPIC-ADD ON - Abnormal; Notable for the following:    Bacteria, UA MANY (*)    All other components within normal limits  LIPASE, BLOOD  PREGNANCY, URINE  I-STAT BETA HCG BLOOD, ED ([facility], [facility], [facility] ONLY)  GC/CHLAMYDIA PROBE AMP ([place])    Imaging Review [place] Transvaginal Non-ob  03/19/2014   CLINICAL DATA:  Pelvic pain and vaginal discharge  EXAM:  TRANSABDOMINAL AND TRANSVAGINAL ULTRASOUND OF PELVIS  TECHNIQUE: Study was performed transabdominally to optimize pelvic field of view evaluation and transvaginally to optimize internal visceral architecture evaluation.  COMPARISON:  CT abdomen and pelvis August 02, 2009  FINDINGS: Uterus  Measurements: 7.9 x 4.7 x 6.2 cm. The uterus has a diffusely inhomogeneous echotexture pattern. There is a well-defined hypoechoic mass in the superior uterine fundus measuring 1.7 x 1.6 x 1.5 cm. No other well-defined mass is seen.  Endometrium  Thickness: 5 mm.  No focal abnormality visualized.  Right ovary  Measurements: 3.3 x 2.6 x 2.7 cm. Normal appearance/no adnexal mass.  Left ovary  Measurements: 3.5 x 2.2 x 3.3 cm. Normal appearance/no  adnexal mass.  Other findings  No free fluid.  IMPRESSION: Uterus has a diffusely inhomogeneous appearance consistent with leiomyomatous change. Only a single well-defined mass is seen consistent with a focal leiomyoma. This leiomyoma is in the superior uterine fundus measuring 1.7 x 1.6 x 1.5 cm. Study otherwise unremarkable.   Electronically Signed   By: Bretta Bang M.D.   On: 03/19/2014 19:40   US Pelvis Complete  03/19/2014   CLINICAL DATA:  Pelvic pain and vaginal discharge  EXAM: TRANSABDOMINAL AND TRANSVAGINAL ULTRASOUND OF PELVIS  TECHNIQUE: Study was performed transabdominally to optimize pelvic field of view evaluation and transvaginally to optimize internal visceral architecture evaluation.  COMPARISON:  CT abdomen and pelvis August 02, 2009  FINDINGS: Uterus  Measurements: 7.9 x 4.7 x 6.2 cm. The uterus has a diffusely inhomogeneous echotexture pattern. There is a well-defined hypoechoic mass in the superior uterine fundus measuring 1.7 x 1.6 x 1.5 cm. No other well-defined mass is seen.  Endometrium  Thickness: 5 mm.  No focal abnormality visualized.  Right ovary  Measurements: 3.3 x 2.6 x 2.7 cm. Normal appearance/no adnexal mass.  Left ovary  Measurements: 3.5 x 2.2 x  3.3 cm. Normal appearance/no adnexal mass.  Other findings  No free fluid.  IMPRESSION: Uterus has a diffusely inhomogeneous appearance consistent with leiomyomatous change. Only a single well-defined mass is seen consistent with a focal leiomyoma. This leiomyoma is in the superior uterine fundus measuring 1.7 x 1.6 x 1.5 cm. Study otherwise unremarkable.   Electronically Signed   By: Bretta Bang M.D.   On: 03/19/2014 19:40   Ct Abdomen Pelvis W Contrast  03/19/2014   CLINICAL DATA:  Severe right lower quadrant abdominal pain, nausea, fever, chills and leukocytosis. Initial encounter.  EXAM: CT ABDOMEN AND PELVIS WITH CONTRAST  TECHNIQUE: Multidetector CT imaging of the abdomen and pelvis was performed using the standard protocol following bolus administration of intravenous contrast.  CONTRAST:  OMNIPAQUE IOHEXOL 300 MG/ML  SOLN  COMPARISON:  CT of the abdomen and pelvis performed 08/02/2009, and pelvic ultrasound performed earlier today at 7:20 p.m.  FINDINGS: Mild bibasilar atelectasis is noted.  Mild wall thickening of the distal esophagus may reflect chronic inflammation. The patient is status post gastric bypass. The gastrojejunal anastomosis is unremarkable in appearance, without evidence for obstruction. The distal stomach contains a small amount of fluid, with minimal likely chronic wall edema.  The liver and spleen are unremarkable in appearance. The gallbladder is mildly distended but otherwise unremarkable. The pancreas and adrenal glands are unremarkable. The common bile duct is unremarkable in appearance.  The kidneys are unremarkable in appearance. There is no evidence of hydronephrosis. No renal or ureteral stones are seen. No perinephric stranding is appreciated.  No free fluid is identified. The small bowel is unremarkable in appearance. The stomach is within normal limits. No acute vascular abnormalities are seen.  The appendix is normal in caliber, without evidence for  appendicitis. The colon is partially filled with fluid and stool, and is unremarkable in appearance.  The bladder is mildly distended and grossly unremarkable. The uterus is within normal limits. The ovaries are relatively symmetric. No suspicious adnexal masses are seen. No inguinal lymphadenopathy is seen.  No acute osseous abnormalities are identified.  IMPRESSION: 1. No acute abnormality seen within the abdomen and pelvis. 2. Minimal wall thickening at the distal esophagus may reflect chronic inflammation, new from 2011. 3. Gastrojejunal anastomosis is unremarkable in appearance. Mild chronic changes noted with regard to the  distal stomach. 4. Gallbladder mildly distended but otherwise unremarkable. 5. Mild bibasilar atelectasis noted.   Electronically Signed   By: Roanna Raider M.D.   On: 03/19/2014 22:17   Medications  fentaNYL (SUBLIMAZE) injection 50 mcg (50 mcg Intravenous Not Given 03/19/14 1631)  0.9 %  sodium chloride infusion (0 mLs Intravenous Stopped 03/19/14 1737)    Followed by  0.9 %  sodium chloride infusion (0 mLs Intravenous Stopped 03/19/14 2029)  ondansetron (ZOFRAN) injection 4 mg (4 mg Intravenous Given 03/19/14 1625)  HYDROmorphone (DILAUDID) injection 1 mg (1 mg Intravenous Given 03/19/14 2029)  cefTRIAXone (ROCEPHIN) 1 g in dextrose 5 % 50 mL IVPB (0 g Intravenous Stopped 03/19/14 2008)  doxycycline (VIBRA-TABS) tablet 100 mg (100 mg Oral Given 03/19/14 1855)  iohexol (OMNIPAQUE) 300 MG/ML solution 50 mL (50 mLs Oral Contrast Given 03/19/14 2032)  iohexol (OMNIPAQUE) 300 MG/ML solution 100 mL (100 mLs Intravenous Contrast Given 03/19/14 2149)  acetaminophen (TYLENOL) tablet 650 mg (650 mg Oral Given 03/19/14 2320)     MDM   Final diagnoses:  Vaginal discharge  PID (acute pelvic inflammatory disease)    The patient has persistent pain and fever. Ultrasound are unremarkable. Clinically patient has albicans inflammatory disease. She had copious vaginal discharge. She had  cervical motion tenderness and uterine tenderness. For persistent pain and fever I will consult with gynecology to discuss possible admission for further treatment.  Plan on transfer to [facility] for treatment. Dw Dr Yolande Jolly Morrison Old, MD 03/19/14 [id number]

## 2014-03-19 NOTE — ED Notes (Signed)
Pt states that since last night she has been having lower right abd pain and chills. Pt also c/o nausea but no vomiting.

## 2014-03-20 ENCOUNTER — Encounter (HOSPITAL_COMMUNITY): Payer: Self-pay | Admitting: *Deleted

## 2014-03-20 DIAGNOSIS — Z91013 Allergy to seafood: Secondary | ICD-10-CM | POA: Diagnosis not present

## 2014-03-20 DIAGNOSIS — N739 Female pelvic inflammatory disease, unspecified: Secondary | ICD-10-CM | POA: Diagnosis present

## 2014-03-20 DIAGNOSIS — I10 Essential (primary) hypertension: Secondary | ICD-10-CM | POA: Diagnosis present

## 2014-03-20 DIAGNOSIS — Z79899 Other long term (current) drug therapy: Secondary | ICD-10-CM | POA: Diagnosis not present

## 2014-03-20 DIAGNOSIS — B20 Human immunodeficiency virus [HIV] disease: Secondary | ICD-10-CM | POA: Diagnosis present

## 2014-03-20 DIAGNOSIS — Z9884 Bariatric surgery status: Secondary | ICD-10-CM | POA: Diagnosis not present

## 2014-03-20 DIAGNOSIS — F329 Major depressive disorder, single episode, unspecified: Secondary | ICD-10-CM | POA: Diagnosis present

## 2014-03-20 DIAGNOSIS — N730 Acute parametritis and pelvic cellulitis: Secondary | ICD-10-CM | POA: Diagnosis present

## 2014-03-20 LAB — GC/CHLAMYDIA PROBE AMP (~~LOC~~) NOT AT ARMC
Chlamydia: NEGATIVE
Neisseria Gonorrhea: POSITIVE — AB

## 2014-03-20 LAB — CBC
HCT: 29.2 % — ABNORMAL LOW (ref 36.0–46.0)
Hemoglobin: 9.7 g/dL — ABNORMAL LOW (ref 12.0–15.0)
MCH: 30.3 pg (ref 26.0–34.0)
MCHC: 33.2 g/dL (ref 30.0–36.0)
MCV: 91.3 fL (ref 78.0–100.0)
Platelets: 279 10*3/uL (ref 150–400)
RBC: 3.2 MIL/uL — ABNORMAL LOW (ref 3.87–5.11)
RDW: 14.3 % (ref 11.5–15.5)
WBC: 31.7 10*3/uL — ABNORMAL HIGH (ref 4.0–10.5)

## 2014-03-20 MED ORDER — DARUNAVIR ETHANOLATE 800 MG PO TABS
800.0000 mg | ORAL_TABLET | Freq: Every day | ORAL | Status: DC
  Administered 2014-03-20: 800 mg via ORAL
  Filled 2014-03-20: qty 1

## 2014-03-20 MED ORDER — IBUPROFEN 600 MG PO TABS
600.0000 mg | ORAL_TABLET | Freq: Four times a day (QID) | ORAL | Status: DC | PRN
  Administered 2014-03-20 – 2014-03-21 (×3): 600 mg via ORAL
  Filled 2014-03-20 (×3): qty 1

## 2014-03-20 MED ORDER — DOXYCYCLINE HYCLATE 100 MG PO TABS
100.0000 mg | ORAL_TABLET | Freq: Two times a day (BID) | ORAL | Status: DC
Start: 2014-03-20 — End: 2014-03-21
  Administered 2014-03-20 – 2014-03-21 (×4): 100 mg via ORAL
  Filled 2014-03-20 (×4): qty 1

## 2014-03-20 MED ORDER — HYDROMORPHONE HCL 1 MG/ML IJ SOLN: 1.0000 mg | INTRAMUSCULAR | Status: DC | PRN

## 2014-03-20 MED ORDER — OXYCODONE-ACETAMINOPHEN 5-325 MG PO TABS
1.0000 | ORAL_TABLET | ORAL | Status: DC | PRN
  Administered 2014-03-20 – 2014-03-21 (×4): 2 via ORAL
  Filled 2014-03-20 (×4): qty 2

## 2014-03-20 MED ORDER — CEFOTETAN DISODIUM 2 G IJ SOLR
2.0000 g | Freq: Two times a day (BID) | INTRAMUSCULAR | Status: DC
  Administered 2014-03-20 – 2014-03-21 (×3): 2 g via INTRAVENOUS
  Filled 2014-03-20 (×4): qty 2

## 2014-03-20 MED ORDER — RALTEGRAVIR POTASSIUM 400 MG PO TABS
400.0000 mg | ORAL_TABLET | Freq: Two times a day (BID) | ORAL | Status: DC
  Administered 2014-03-20: 400 mg via ORAL
  Filled 2014-03-20 (×3): qty 1

## 2014-03-20 MED ORDER — DEXTROSE-NACL 5-0.45 % IV SOLN
INTRAVENOUS | Status: DC
  Administered 2014-03-20: 04:00:00 via INTRAVENOUS
  Administered 2014-03-20 – 2014-03-21 (×2): 1 mL via INTRAVENOUS

## 2014-03-20 MED ORDER — ONDANSETRON HCL 4 MG/2ML IJ SOLN
4.0000 mg | Freq: Four times a day (QID) | INTRAMUSCULAR | Status: DC | PRN
  Administered 2014-03-20: 4 mg via INTRAVENOUS
  Filled 2014-03-20: qty 2

## 2014-03-20 MED ORDER — LAMIVUDINE-ZIDOVUDINE 150-300 MG PO TABS
2.0000 | ORAL_TABLET | Freq: Every day | ORAL | Status: DC
  Administered 2014-03-20: 2 via ORAL
  Filled 2014-03-20: qty 2

## 2014-03-20 MED ORDER — DARUNAVIR ETHANOLATE 800 MG PO TABS
800.0000 mg | ORAL_TABLET | Freq: Every day | ORAL | Status: DC
  Administered 2014-03-20 (×2): 800 mg via ORAL
  Filled 2014-03-20 (×3): qty 1

## 2014-03-20 MED ORDER — RALTEGRAVIR POTASSIUM 400 MG PO TABS
800.0000 mg | ORAL_TABLET | Freq: Every day | ORAL | Status: DC
  Administered 2014-03-20: 800 mg via ORAL
  Filled 2014-03-20: qty 2

## 2014-03-20 MED ORDER — DOXYCYCLINE HYCLATE 100 MG PO TABS: 100.0000 mg | ORAL_TABLET | Freq: Two times a day (BID) | ORAL | Status: DC

## 2014-03-20 MED ORDER — DIPHENHYDRAMINE HCL 25 MG PO CAPS
25.0000 mg | ORAL_CAPSULE | Freq: Four times a day (QID) | ORAL | Status: DC | PRN
  Administered 2014-03-20: 25 mg via ORAL
  Filled 2014-03-20: qty 1

## 2014-03-20 MED ORDER — RITONAVIR 100 MG PO TABS
100.0000 mg | ORAL_TABLET | Freq: Every day | ORAL | Status: DC
  Administered 2014-03-20: 100 mg via ORAL
  Filled 2014-03-20 (×3): qty 1

## 2014-03-20 MED ORDER — OXYCODONE-ACETAMINOPHEN 5-325 MG PO TABS
1.0000 | ORAL_TABLET | Freq: Four times a day (QID) | ORAL | Status: DC | PRN
  Administered 2014-03-20: 2 via ORAL
  Filled 2014-03-20: qty 2

## 2014-03-20 MED ORDER — ADULT MULTIVITAMIN W/MINERALS CH
1.0000 | ORAL_TABLET | Freq: Every day | ORAL | Status: DC
  Administered 2014-03-20 – 2014-03-21 (×2): 1 via ORAL
  Filled 2014-03-20 (×2): qty 1

## 2014-03-20 MED ORDER — HYDROMORPHONE HCL 1 MG/ML IJ SOLN
1.0000 mg | INTRAMUSCULAR | Status: DC | PRN
  Administered 2014-03-20: 2 mg via INTRAVENOUS
  Administered 2014-03-20: 1 mg via INTRAVENOUS
  Administered 2014-03-20: 2 mg via INTRAVENOUS
  Filled 2014-03-20: qty 2
  Filled 2014-03-20: qty 1
  Filled 2014-03-20: qty 2

## 2014-03-20 MED ORDER — RITONAVIR 100 MG PO TABS
100.0000 mg | ORAL_TABLET | Freq: Every day | ORAL | Status: DC
  Administered 2014-03-20: 100 mg via ORAL
  Filled 2014-03-20: qty 1

## 2014-03-20 MED ORDER — DEXTROSE 5 % IV SOLN: 2.0000 g | Freq: Three times a day (TID) | INTRAVENOUS | Status: DC

## 2014-03-20 MED ORDER — AMLODIPINE BESYLATE 5 MG PO TABS
5.0000 mg | ORAL_TABLET | Freq: Every day | ORAL | Status: DC
  Administered 2014-03-20 – 2014-03-21 (×2): 5 mg via ORAL
  Filled 2014-03-20 (×2): qty 1

## 2014-03-20 MED ORDER — TRAZODONE HCL 100 MG PO TABS
100.0000 mg | ORAL_TABLET | Freq: Every day | ORAL | Status: DC
  Administered 2014-03-20: 100 mg via ORAL
  Filled 2014-03-20: qty 1

## 2014-03-20 MED ORDER — LAMIVUDINE-ZIDOVUDINE 150-300 MG PO TABS
1.0000 | ORAL_TABLET | Freq: Two times a day (BID) | ORAL | Status: DC
  Administered 2014-03-20: 1 via ORAL
  Filled 2014-03-20 (×3): qty 1

## 2014-03-20 NOTE — H&P (Signed)
Ariel Cox is an 44 y.o. female. P1 LMP 1 week prev who was [facility] with dx of PID. Pt had no evidence of TOA on imaging but was febrile and unable to tolerate the pain with medications by mouth.  Per the ER doc at [facility], the pt had purulent discharge coming from her cervix and CMT.  The pt reports that 2 days ago she began to have pain in her l;ower and upper abd.  She went to the ED yesterday am and was dx'd with PID.  She reports that she was last sexually active 2-3 months prev.  She c/o nausea with no emesis and fever (did not take temp).     Pertinent Gynecological History: Menses: regular every month without intermenstrual spotting Sexually transmitted diseases: past history: HIV OB History: G1, P1   Menstrual History:  Patient's last menstrual period was 03/13/2014 (exact date).    Past Medical History  Diagnosis Date  . HIV infection   . Depression   . Hypertension     Past Surgical History  Procedure Laterality Date  . Gastric bypass  2009    Family History  Problem Relation Age of Onset  . Breast cancer Maternal Aunt   . Diabetes Maternal Grandmother   . Cancer Paternal Grandmother     Pancreatic  . Cancer Maternal Grandfather     lung    Social History:  reports that she has never smoked. She has never used smokeless tobacco. She reports that she drinks alcohol. She reports that she does not use illicit drugs.  Allergies:  Allergies  Allergen Reactions  . Shellfish Allergy Rash    Prescriptions prior to admission  Medication Sig Dispense Refill Last Dose  . Acetaminophen-DM (COLD & COUGH DAYTIME PO) Take 30 mLs by mouth 2 (two) times daily as needed (cold symptoms).   03/18/2014 at Unknown time  . amLODipine (NORVASC) 5 MG tablet TAKE 1 TABLET BY MOUTH EVERY DAY 30 tablet 2 03/18/2014 at Unknown time  . Darunavir Ethanolate (PREZISTA) 800 MG tablet TAKE 1 TABLET BY MOUTH DAILY 30 tablet 6 03/18/2014 at 2300  . lamiVUDine-zidovudine  (COMBIVIR) 150-300 MG per tablet Take 1 tablet by mouth 2 (two) times daily. 60 tablet 6 03/18/2014 at 2300  . raltegravir (ISENTRESS) 400 MG tablet TAKE 1 TABLET BY MOUTH TWICE DAILY 60 tablet 6 03/18/2014 at 2300  . ritonavir (NORVIR) 100 MG TABS tablet TAKE 1 TABLET BY MOUTH DAILY WITH BREAKFAST 30 tablet 6 03/18/2014 at 2300  . traZODone (DESYREL) 100 MG tablet Take 1 tablet (100 mg total) by mouth at bedtime. 30 tablet 5 Past Week at Unknown time    ROS  Blood pressure 100/54, pulse 80, temperature 98.7 F (37.1 C), temperature source Oral, resp. rate 22, height 5\' 7"  (1.702 m), weight 303 lb 7 oz (137.638 kg), last menstrual period 03/13/2014, SpO2 98 %. Physical Exam BP 100/54 mmHg  Pulse 80  Temp(Src) 98.7 F (37.1 C) (Oral)  Resp 22  Ht 5\' 7"  (1.702 m)  Wt 303 lb 7 oz (137.638 kg)  BMI 47.51 kg/m2  SpO2 98%  LMP 03/13/2014 (Exact Date) Pt in NAD Lungs: CTA CV: RRR Abd: obese, diffusely tender.  Greatest in the RLQ and RUQ.  No rebound or guarding Ext: no edema  Results for orders placed or performed during the hospital encounter of 03/19/14 (from the past 24 hour(s))  CBC with Differential     Status: Abnormal   Collection Time: 03/19/14  3:25  PM  Result Value Ref Range   WBC 13.2 (H) 4.0 - 10.5 K/uL   RBC 3.47 (L) 3.87 - 5.11 MIL/uL   Hemoglobin 10.3 (L) 12.0 - 15.0 g/dL   HCT 16.1 (L) 09.6 - 04.5 %   MCV 92.5 78.0 - 100.0 fL   MCH 29.7 26.0 - 34.0 pg   MCHC 32.1 30.0 - 36.0 g/dL   RDW 40.9 81.1 - 91.4 %   Platelets 314 150 - 400 K/uL   Neutrophils Relative % 83 (H) 43 - 77 %   Neutro Abs 11.1 (H) 1.7 - 7.7 K/uL   Lymphocytes Relative 11 (L) 12 - 46 %   Lymphs Abs 1.4 0.7 - 4.0 K/uL   Monocytes Relative 6 3 - 12 %   Monocytes Absolute 0.7 0.1 - 1.0 K/uL   Eosinophils Relative 0 0 - 5 %   Eosinophils Absolute 0.0 0.0 - 0.7 K/uL   Basophils Relative 0 0 - 1 %   Basophils Absolute 0.0 0.0 - 0.1 K/uL  Comprehensive metabolic panel     Status: Abnormal   Collection  Time: 03/19/14  3:25 PM  Result Value Ref Range   Sodium 137 135 - 145 mmol/L   Potassium 3.2 (L) 3.5 - 5.1 mmol/L   Chloride 107 96 - 112 mmol/L   CO2 23 19 - 32 mmol/L   Glucose, Bld 123 (H) 70 - 99 mg/dL   BUN 11 6 - 23 mg/dL   Creatinine, Ser 7.82 0.50 - 1.10 mg/dL   Calcium 7.9 (L) 8.4 - 10.5 mg/dL   Total Protein 7.2 6.0 - 8.3 g/dL   Albumin 3.5 3.5 - 5.2 g/dL   AST 18 0 - 37 U/L   ALT 14 0 - 35 U/L   Alkaline Phosphatase 83 39 - 117 U/L   Total Bilirubin 0.7 0.3 - 1.2 mg/dL   GFR calc non Af Amer 68 (L) >90 mL/min   GFR calc Af Amer 79 (L) >90 mL/min   Anion gap 7 5 - 15  Lipase, blood     Status: None   Collection Time: 03/19/14  3:25 PM  Result Value Ref Range   Lipase 18 11 - 59 U/L  I-Stat Beta hCG blood, ED ([facility], [facility], [facility] only)     Status: None   Collection Time: 03/19/14  5:48 PM  Result Value Ref Range   I-stat hCG, quantitative <5.0 <5 mIU/mL   Comment 3          Wet prep, genital     Status: Abnormal   Collection Time: 03/19/14  6:10 PM  Result Value Ref Range   Yeast Wet Prep HPF POC NONE SEEN NONE SEEN   Trich, Wet Prep NONE SEEN NONE SEEN   Clue Cells Wet Prep HPF POC FEW (A) NONE SEEN   WBC, Wet Prep HPF POC FEW (A) NONE SEEN  Pregnancy, urine     Status: None   Collection Time: 03/19/14  6:32 PM  Result Value Ref Range   Preg Test, Ur NEGATIVE NEGATIVE  Urinalysis, Routine w reflex microscopic     Status: Abnormal   Collection Time: 03/19/14  6:32 PM  Result Value Ref Range   Color, Urine YELLOW YELLOW   APPearance CLOUDY (A) CLEAR   Specific Gravity, Urine 1.019 1.005 - 1.030   pH 6.0 5.0 - 8.0   Glucose, UA NEGATIVE NEGATIVE mg/dL   Hgb urine dipstick MODERATE (A) NEGATIVE   Bilirubin Urine NEGATIVE NEGATIVE   Ketones,  ur NEGATIVE NEGATIVE mg/dL   Protein, ur 30 (A) NEGATIVE mg/dL   Urobilinogen, UA 1.0 0.0 - 1.0 mg/dL   Nitrite POSITIVE (A) NEGATIVE   Leukocytes, UA SMALL (A) NEGATIVE  Urine microscopic-add on     Status: Abnormal    Collection Time: 03/19/14  6:32 PM  Result Value Ref Range   Squamous Epithelial / LPF RARE RARE   WBC, UA 3-6 <3 WBC/hpf   RBC / HPF 0-2 <3 RBC/hpf   Bacteria, UA MANY (A) RARE    US Transvaginal Non-ob  03/19/2014   CLINICAL DATA:  Pelvic pain and vaginal discharge  EXAM: TRANSABDOMINAL AND TRANSVAGINAL ULTRASOUND OF PELVIS  TECHNIQUE: Study was performed transabdominally to optimize pelvic field of view evaluation and transvaginally to optimize internal visceral architecture evaluation.  COMPARISON:  CT abdomen and pelvis August 02, 2009  FINDINGS: Uterus  Measurements: 7.9 x 4.7 x 6.2 cm. The uterus has a diffusely inhomogeneous echotexture pattern. There is a well-defined hypoechoic mass in the superior uterine fundus measuring 1.7 x 1.6 x 1.5 cm. No other well-defined mass is seen.  Endometrium  Thickness: 5 mm.  No focal abnormality visualized.  Right ovary  Measurements: 3.3 x 2.6 x 2.7 cm. Normal appearance/no adnexal mass.  Left ovary  Measurements: 3.5 x 2.2 x 3.3 cm. Normal appearance/no adnexal mass.  Other findings  No free fluid.  IMPRESSION: Uterus has a diffusely inhomogeneous appearance consistent with leiomyomatous change. Only a single well-defined mass is seen consistent with a focal leiomyoma. This leiomyoma is in the superior uterine fundus measuring 1.7 x 1.6 x 1.5 cm. Study otherwise unremarkable.   Electronically Signed   By: Bretta Bang M.D.   On: 03/19/2014 19:40   US Pelvis Complete  03/19/2014   CLINICAL DATA:  Pelvic pain and vaginal discharge  EXAM: TRANSABDOMINAL AND TRANSVAGINAL ULTRASOUND OF PELVIS  TECHNIQUE: Study was performed transabdominally to optimize pelvic field of view evaluation and transvaginally to optimize internal visceral architecture evaluation.  COMPARISON:  CT abdomen and pelvis August 02, 2009  FINDINGS: Uterus  Measurements: 7.9 x 4.7 x 6.2 cm. The uterus has a diffusely inhomogeneous echotexture pattern. There is a well-defined hypoechoic mass in  the superior uterine fundus measuring 1.7 x 1.6 x 1.5 cm. No other well-defined mass is seen.  Endometrium  Thickness: 5 mm.  No focal abnormality visualized.  Right ovary  Measurements: 3.3 x 2.6 x 2.7 cm. Normal appearance/no adnexal mass.  Left ovary  Measurements: 3.5 x 2.2 x 3.3 cm. Normal appearance/no adnexal mass.  Other findings  No free fluid.  IMPRESSION: Uterus has a diffusely inhomogeneous appearance consistent with leiomyomatous change. Only a single well-defined mass is seen consistent with a focal leiomyoma. This leiomyoma is in the superior uterine fundus measuring 1.7 x 1.6 x 1.5 cm. Study otherwise unremarkable.   Electronically Signed   By: Bretta Bang M.D.   On: 03/19/2014 19:40   Ct Abdomen Pelvis W Contrast  03/19/2014   CLINICAL DATA:  Severe right lower quadrant abdominal pain, nausea, fever, chills and leukocytosis. Initial encounter.  EXAM: CT ABDOMEN AND PELVIS WITH CONTRAST  TECHNIQUE: Multidetector CT imaging of the abdomen and pelvis was performed using the standard protocol following bolus administration of intravenous contrast.  CONTRAST:  OMNIPAQUE IOHEXOL 300 MG/ML  SOLN  COMPARISON:  CT of the abdomen and pelvis performed 08/02/2009, and pelvic ultrasound performed earlier today at 7:20 p.m.  FINDINGS: Mild bibasilar atelectasis is noted.  Mild wall thickening of the  distal esophagus may reflect chronic inflammation. The patient is status post gastric bypass. The gastrojejunal anastomosis is unremarkable in appearance, without evidence for obstruction. The distal stomach contains a small amount of fluid, with minimal likely chronic wall edema.  The liver and spleen are unremarkable in appearance. The gallbladder is mildly distended but otherwise unremarkable. The pancreas and adrenal glands are unremarkable. The common bile duct is unremarkable in appearance.  The kidneys are unremarkable in appearance. There is no evidence of hydronephrosis. No renal or ureteral  stones are seen. No perinephric stranding is appreciated.  No free fluid is identified. The small bowel is unremarkable in appearance. The stomach is within normal limits. No acute vascular abnormalities are seen.  The appendix is normal in caliber, without evidence for appendicitis. The colon is partially filled with fluid and stool, and is unremarkable in appearance.  The bladder is mildly distended and grossly unremarkable. The uterus is within normal limits. The ovaries are relatively symmetric. No suspicious adnexal masses are seen. No inguinal lymphadenopathy is seen.  No acute osseous abnormalities are identified.  IMPRESSION: 1. No acute abnormality seen within the abdomen and pelvis. 2. Minimal wall thickening at the distal esophagus may reflect chronic inflammation, new from 2011. 3. Gastrojejunal anastomosis is unremarkable in appearance. Mild chronic changes noted with regard to the distal stomach. 4. Gallbladder mildly distended but otherwise unremarkable. 5. Mild bibasilar atelectasis noted.   Electronically Signed   By: Roanna Raider M.D.   On: 03/19/2014 22:17    Assessment/Plan: 44 yo with presumed PID.  Will start Cefotan and Doxycycline and observe. Pt is now afebrile. Reg diet CBC in am HARRAWAY-SMITH, Christianna Belmonte 03/20/2014, 7:03 AM

## 2014-03-20 NOTE — Progress Notes (Signed)
Ur chart review completed.  

## 2014-03-20 NOTE — Plan of Care (Signed)
Problem: Consults Goal: General Medical Patient Education See Patient Education Module for specific education.  Outcome: Completed/Met Date Met:  03/20/14 Plan of care discussed with patient and states she understands.  Problem: Phase I Progression Outcomes Goal: Pain controlled with appropriate interventions Outcome: Completed/Met Date Met:  03/20/14 Pain controlled on IV Dilaudid Goal: OOB as tolerated unless otherwise ordered Outcome: Completed/Met Date Met:  03/20/14 Tolerated walk to bathroom well. Goal: Initial discharge plan identified Outcome: Completed/Met Date Met:  03/20/14 VSS,Afebrile  Pain controlled  Resolving pelvic infection Goal: Voiding-avoid urinary catheter unless indicated Outcome: Completed/Met Date Met:  03/20/14 Voided upon arrival to floor,small amount of amber urine. Goal: Hemodynamically stable Outcome: Completed/Met Date Met:  03/20/14 VSS at this time.

## 2014-03-21 DIAGNOSIS — N730 Acute parametritis and pelvic cellulitis: Secondary | ICD-10-CM

## 2014-03-21 MED ORDER — IBUPROFEN 600 MG PO TABS: 600.0000 mg | ORAL_TABLET | Freq: Four times a day (QID) | ORAL | Status: DC | PRN

## 2014-03-21 MED ORDER — DOXYCYCLINE HYCLATE 100 MG PO TABS: 100.0000 mg | ORAL_TABLET | Freq: Two times a day (BID) | ORAL | Status: DC

## 2014-03-21 MED ORDER — OXYCODONE-ACETAMINOPHEN 5-325 MG PO TABS: 1.0000 | ORAL_TABLET | ORAL | Status: DC | PRN

## 2014-03-21 MED ORDER — PROMETHAZINE HCL 25 MG PO TABS: 25.0000 mg | ORAL_TABLET | Freq: Four times a day (QID) | ORAL | Status: DC | PRN

## 2014-03-21 MED ORDER — METRONIDAZOLE 500 MG PO TABS: 500.0000 mg | ORAL_TABLET | Freq: Two times a day (BID) | ORAL | Status: AC

## 2014-03-21 NOTE — Discharge Summary (Signed)
Physician Discharge Summary  Patient ID: Ariel Cox MRN: [id number] DOB/AGE: [birth year] 44 y.o.  Admit date: 03/19/2014 Discharge date: 03/21/2014   Discharge Diagnoses:  Principal Problem:   PID (acute pelvic inflammatory disease) Active Problems:   Human immunodeficiency virus (HIV) disease   Essential hypertension, benign   Consults: None  Significant Diagnostic Studies: labs:  CBC    Component Value Date/Time   WBC 31.7* 03/20/2014 0750   RBC 3.20* 03/20/2014 0750   HGB 9.7* 03/20/2014 0750   HCT 29.2* 03/20/2014 0750   PLT 279 03/20/2014 0750   MCV 91.3 03/20/2014 0750   MCH 30.3 03/20/2014 0750   MCHC 33.2 03/20/2014 0750   RDW 14.3 03/20/2014 0750   LYMPHSABS 1.4 03/19/2014 1525   MONOABS 0.7 03/19/2014 1525   EOSABS 0.0 03/19/2014 1525   BASOSABS 0.0 03/19/2014 1525   Chlamydia CT: Negative   Comments: Normal Reference Range - Negative   Neisseria gonorrhea **NG: POSITIVE** (A)     [place] Transvaginal Non-ob  03/19/2014   CLINICAL DATA:  Pelvic pain and vaginal discharge  EXAM: TRANSABDOMINAL AND TRANSVAGINAL ULTRASOUND OF PELVIS  TECHNIQUE: Study was performed transabdominally to optimize pelvic field of view evaluation and transvaginally to optimize internal visceral architecture evaluation.  COMPARISON:  CT abdomen and pelvis August 02, 2009  FINDINGS: Uterus  Measurements: 7.9 x 4.7 x 6.2 cm. The uterus has a diffusely inhomogeneous echotexture pattern. There is a well-defined hypoechoic mass in the superior uterine fundus measuring 1.7 x 1.6 x 1.5 cm. No other well-defined mass is seen.  Endometrium  Thickness: 5 mm.  No focal abnormality visualized.  Right ovary  Measurements: 3.3 x 2.6 x 2.7 cm. Normal appearance/no adnexal mass.  Left ovary  Measurements: 3.5 x 2.2 x 3.3 cm. Normal appearance/no adnexal mass.  Other findings  No free fluid.  IMPRESSION: Uterus has a diffusely inhomogeneous appearance consistent with leiomyomatous change. Only a single  well-defined mass is seen consistent with a focal leiomyoma. This leiomyoma is in the superior uterine fundus measuring 1.7 x 1.6 x 1.5 cm. Study otherwise unremarkable.   Electronically Signed   By: Bretta BangWilliam  Woodruff M.D.   On: 03/19/2014 19:40   [place] Pelvis Complete  03/19/2014   CLINICAL DATA:  Pelvic pain and vaginal discharge  EXAM: TRANSABDOMINAL AND TRANSVAGINAL ULTRASOUND OF PELVIS  TECHNIQUE: Study was performed transabdominally to optimize pelvic field of view evaluation and transvaginally to optimize internal visceral architecture evaluation.  COMPARISON:  CT abdomen and pelvis August 02, 2009  FINDINGS: Uterus  Measurements: 7.9 x 4.7 x 6.2 cm. The uterus has a diffusely inhomogeneous echotexture pattern. There is a well-defined hypoechoic mass in the superior uterine fundus measuring 1.7 x 1.6 x 1.5 cm. No other well-defined mass is seen.  Endometrium  Thickness: 5 mm.  No focal abnormality visualized.  Right ovary  Measurements: 3.3 x 2.6 x 2.7 cm. Normal appearance/no adnexal mass.  Left ovary  Measurements: 3.5 x 2.2 x 3.3 cm. Normal appearance/no adnexal mass.  Other findings  No free fluid.  IMPRESSION: Uterus has a diffusely inhomogeneous appearance consistent with leiomyomatous change. Only a single well-defined mass is seen consistent with a focal leiomyoma. This leiomyoma is in the superior uterine fundus measuring 1.7 x 1.6 x 1.5 cm. Study otherwise unremarkable.   Electronically Signed   By: Bretta BangWilliam  Woodruff M.D.   On: 03/19/2014 19:40   Ct Abdomen Pelvis W Contrast  03/19/2014   CLINICAL DATA:  Severe right lower quadrant abdominal pain, nausea, fever,  chills and leukocytosis. Initial encounter.  EXAM: CT ABDOMEN AND PELVIS WITH CONTRAST  TECHNIQUE: Multidetector CT imaging of the abdomen and pelvis was performed using the standard protocol following bolus administration of intravenous contrast.  CONTRAST:  OMNIPAQUE IOHEXOL 300 MG/ML  SOLN  COMPARISON:  CT of the abdomen and pelvis  performed 08/02/2009, and pelvic ultrasound performed earlier today at 7:20 p.m.  FINDINGS: Mild bibasilar atelectasis is noted.  Mild wall thickening of the distal esophagus may reflect chronic inflammation. The patient is status post gastric bypass. The gastrojejunal anastomosis is unremarkable in appearance, without evidence for obstruction. The distal stomach contains a small amount of fluid, with minimal likely chronic wall edema.  The liver and spleen are unremarkable in appearance. The gallbladder is mildly distended but otherwise unremarkable. The pancreas and adrenal glands are unremarkable. The common bile duct is unremarkable in appearance.  The kidneys are unremarkable in appearance. There is no evidence of hydronephrosis. No renal or ureteral stones are seen. No perinephric stranding is appreciated.  No free fluid is identified. The small bowel is unremarkable in appearance. The stomach is within normal limits. No acute vascular abnormalities are seen.  The appendix is normal in caliber, without evidence for appendicitis. The colon is partially filled with fluid and stool, and is unremarkable in appearance.  The bladder is mildly distended and grossly unremarkable. The uterus is within normal limits. The ovaries are relatively symmetric. No suspicious adnexal masses are seen. No inguinal lymphadenopathy is seen.  No acute osseous abnormalities are identified.  IMPRESSION: 1. No acute abnormality seen within the abdomen and pelvis. 2. Minimal wall thickening at the distal esophagus may reflect chronic inflammation, new from 2011. 3. Gastrojejunal anastomosis is unremarkable in appearance. Mild chronic changes noted with regard to the distal stomach. 4. Gallbladder mildly distended but otherwise unremarkable. 5. Mild bibasilar atelectasis noted.   Electronically Signed   By: Roanna Raider M.D.   On: 03/19/2014 22:17     Hospital Course:Ariel Cox is an 44 y.o. female. P1 LMP 1 week prev who was  transferred from [facility] with dx of PID. Pt had no evidence of TOA on imaging but was febrile and unable to tolerate the pain with medications by mouth.Patient had purulent discharge coming from her cervix and CMT. Patient admitted and given IV fluids, IV antibiotics and IV pain meds.  She quickly defervesed and was no longer nauseated.  She has been tolerating a regular diet. She was continued on her HIV regimen.  She was afebrile x 48 hours, with no emesis, and improving lower abdominal pain and was deemed stable for discharge.  Discharge exam Physical Exam  Constitutional: She appears well-developed and well-nourished. No distress.  Cardiovascular: Normal rate.   Pulmonary/Chest: Effort normal.  Abdominal: Soft. There is tenderness (minimal).   Filed Vitals:   03/21/14 0549  BP: 102/51  Pulse: 72  Temp: 98.6 F (37 C)  Resp: 18    Disposition: 01-Home or Self Care  Discharged Condition: fair     Medication List    TAKE these medications        amLODipine 5 MG tablet  Commonly known as:  NORVASC  TAKE 1 TABLET BY MOUTH EVERY DAY     COLD & COUGH DAYTIME PO  Take 30 mLs by mouth 2 (two) times daily as needed (cold symptoms).     Darunavir Ethanolate 800 MG tablet  Commonly known as:  PREZISTA  TAKE 1 TABLET BY MOUTH DAILY  doxycycline 100 MG tablet  Commonly known as:  VIBRA-TABS  Take 1 tablet (100 mg total) by mouth every 12 (twelve) hours.     ibuprofen 600 MG tablet  Commonly known as:  ADVIL,MOTRIN  Take 1 tablet (600 mg total) by mouth every 6 (six) hours as needed for fever or headache.     lamiVUDine-zidovudine 150-300 MG per tablet  Commonly known as:  COMBIVIR  Take 1 tablet by mouth 2 (two) times daily.     metroNIDAZOLE 500 MG tablet  Commonly known as:  FLAGYL  Take 1 tablet (500 mg total) by mouth 2 (two) times daily.     oxyCODONE-acetaminophen 5-325 MG per tablet  Commonly known as:  PERCOCET/ROXICET  Take 1-2 tablets by mouth every 4 (four)  hours as needed for moderate pain or severe pain.     promethazine 25 MG tablet  Commonly known as:  PHENERGAN  Take 1 tablet (25 mg total) by mouth every 6 (six) hours as needed for nausea.     raltegravir 400 MG tablet  Commonly known as:  ISENTRESS  TAKE 1 TABLET BY MOUTH TWICE DAILY     ritonavir 100 MG Tabs tablet  Commonly known as:  NORVIR  TAKE 1 TABLET BY MOUTH DAILY WITH BREAKFAST     traZODone 100 MG tablet  Commonly known as:  DESYREL  Take 1 tablet (100 mg total) by mouth at bedtime.           Follow-up Information    Follow up with [facility] for Infectious Disease.   Specialty:  Infectious Diseases   Why:  keep next appointment   Contact information:   [address] [place], [place] [address] [id number] mc [place] [address] [phone]      Follow up with [facility] In 4 weeks.   Specialty:  Obstetrics and Gynecology   Why:  Hosptial follow-up, they will call you with an appointment   Contact information:   [address] [place] [address] [phone]      Signed: Reva Bores 03/21/2014, 9:04 AM

## 2014-03-21 NOTE — Progress Notes (Signed)
Pt discharged home with mother... Discharge instructions reviewed with pt and she verbalized understanding... Condition stable... No equipment... Ambulated to car with L. Ilsa IhaSnyder, NT.

## 2014-03-21 NOTE — Discharge Instructions (Signed)
Gonorrhea °Gonorrhea is an infection that can cause serious problems. If left untreated, the infection may:  °· Damage the female or female organs.   °· Cause women to be unable to have children (sterility).   °· Harm a fetus if the infected woman is pregnant.   °It is important to get treatment for gonorrhea as soon as possible. It is also necessary that all your sexual partners be tested for the infection.  °CAUSES  °Gonorrhea is caused by bacteria called Neisseria gonorrhoeae. The infection is spread from person to person, usually by sexual contact (such as by anal, vaginal, or oral means). A newborn can contract the infection from his or her mother during birth.  °SYMPTOMS  °Some people with gonorrhea do not have symptoms. Symptoms may be different in females and males.  °Females  °The most common symptoms are:  °· Pain in the lower abdomen.   °· Fever with or without chills.   °Other symptoms include:  °· Abnormal vaginal discharge.   °· Painful intercourse.   °· Burning or itching of the vagina or lips of the vagina.   °· Abnormal vaginal bleeding.   °· Pain when urinating.   °· Long-lasting (chronic) pain in the lower abdomen, especially during menstruation or intercourse.   °· Inability to become pregnant.   °· Going into premature labor.   °· Irritation, pain, bleeding, or discharge from the rectum. This may occur if the infection was spread by anal sex.   °· Sore throat or swollen lymph nodes in the neck. This may occur if the infection was spread by oral sex.   °Males  °The most common symptoms are:  °· Discharge from the penis.   °· Pain or burning during urination.   °· Pain or swelling in the testicles. °Other symptoms may include:  °· Irritation, pain, bleeding, or discharge from the rectum. This may occur if the infection was spread by anal sex.   °· Sore throat, fever, or swollen lymph nodes in the neck. This may occur if the infection was spread by oral sex.   °DIAGNOSIS  °A diagnosis is made after a  physical exam is done and a sample of discharge is examined under a microscope for the presence of the bacteria. The discharge may be taken from the urethra, cervix, throat, or rectum.  °TREATMENT  °Gonorrhea is treated with antibiotic medicines. It is important for treatment to begin as soon as possible. Early treatment may prevent some problems from developing.  °HOME CARE INSTRUCTIONS  °· Take medicines only as directed by your health care provider.   °· Take your antibiotic medicine as directed by your health care provider. Finish the antibiotic even if you start to feel better. Incomplete treatment will put you at risk for continued infection.   °· Do not have sex until treatment is complete or as directed by your health care provider.   °· Keep all follow-up visits as directed by your health care provider.   °· Not all test results are available during your visit. If your test results are not back during the visit, make an appointment with your health care provider to find out the results. Do not assume everything is normal if you have not heard from your health care provider or the medical facility. It is your responsibility to get your test results. °· If you test positive for gonorrhea, inform your recent sexual partners. They need to be checked for gonorrhea even if they do not have symptoms. They may need treatment, even if they test negative for gonorrhea.   °SEEK MEDICAL CARE IF:  °· You develop any bad reaction   to the medicine you were prescribed. This may include:   A rash.   Nausea.   Vomiting.   Diarrhea.   Your symptoms do not improve after a few days of taking antibiotics.   Your symptoms get worse.   You develop increased pain, such as in the testicles (for males) or in the abdomen (for females).  You have a fever. MAKE SURE YOU:   Understand these instructions.  Will watch your condition.  Will get help right away if you are not doing well or get worse. Document  Released: 02/01/2000 Document Revised: 06/20/2013 Document Reviewed: 08/11/2012 [employer] Patient Information 2015 [place], [place]. This information is not intended to replace advice given to you by your health care provider. Make sure you discuss any questions you have with your health care provider. Pelvic Inflammatory Disease Pelvic inflammatory disease (PID) refers to an infection in some or all of the female organs. The infection can be in the uterus, ovaries, fallopian tubes, or the surrounding tissues in the pelvis. PID can cause abdominal or pelvic pain that comes on suddenly (acute pelvic pain). PID is a serious infection because it can lead to lasting (chronic) pelvic pain or the inability to have children (infertile).  CAUSES  The infection is often caused by the normal bacteria found in the vaginal tissues. PID may also be caused by an infection that is spread during sexual contact. PID can also occur following:   The birth of a baby.   A miscarriage.   An abortion.   Major pelvic surgery.   The use of an intrauterine device (IUD).   A sexual assault.  RISK FACTORS Certain factors can put a person at higher risk for PID, such as:  Being younger than 25 years.  Being sexually active at [place] age.  Usingnonbarrier contraception.  Havingmultiple sexual partners.  Having sex with someone who has symptoms of a genital infection.  Using oral contraception. Other times, certain behaviors can increase the possibility of getting PID, such as:  Having sex during your period.  Using a vaginal douche.  Having an intrauterine device (IUD) in place. SYMPTOMS   Abdominal or pelvic pain.   Fever.   Chills.   Abnormal vaginal discharge.  Abnormal uterine bleeding.   Unusual pain shortly after finishing your period. DIAGNOSIS  Your caregiver will choose some of the following methods to make a diagnosis, such as:   Performinga physical exam and history. A  pelvic exam typically reveals a very tender uterus and surrounding pelvis.   Ordering laboratory tests including a pregnancy test, blood tests, and urine test.  Orderingcultures of the vagina and cervix to check for a sexually transmitted infection (STI).  Performing an ultrasound.   Performing a laparoscopic procedure to look inside the pelvis.  TREATMENT   Antibiotic medicines may be prescribed and taken by mouth.   Sexual partners may be treated when the infection is caused by a sexually transmitted disease (STD).   Hospitalization may be needed to give antibiotics intravenously.  Surgery may be needed, but this is rare. It may take weeks until you are completely well. If you are diagnosed with PID, you should also be checked for human immunodeficiency virus (HIV). HOME CARE INSTRUCTIONS   If given, take your antibiotics as directed. Finish the medicine even if you start to feel better.   Only take over-the-counter or prescription medicines for pain, discomfort, or fever as directed by your caregiver.   Do not have sexual intercourse until treatment  is completed or as directed by your caregiver. If PID is confirmed, your recent sexual partner(s) will need treatment.   Keep your follow-up appointments. SEEK MEDICAL CARE IF:   You have increased or abnormal vaginal discharge.   You need prescription medicine for your pain.   You vomit.   You cannot take your medicines.   Your partner has an STD.  SEEK IMMEDIATE MEDICAL CARE IF:   You have a fever.   You have increased abdominal or pelvic pain.   You have chills.   You have pain when you urinate.   You are not better after 72 hours following treatment.  MAKE SURE YOU:   Understand these instructions.  Will watch your condition.  Will get help right away if you are not doing well or get worse. Document Released: 02/03/2005 Document Revised: 05/31/2012 Document Reviewed:  01/30/2011 [facility] Patient Information 2015 [place]. This information is not intended to replace advice given to you by your health care provider. Make sure you discuss any questions you have with your health care provider.

## 2014-03-30 ENCOUNTER — Other Ambulatory Visit (INDEPENDENT_AMBULATORY_CARE_PROVIDER_SITE_OTHER): Payer: Self-pay

## 2014-03-30 DIAGNOSIS — B20 Human immunodeficiency virus [HIV] disease: Secondary | ICD-10-CM

## 2014-03-31 LAB — T-HELPER CELL (CD4) - (RCID CLINIC ONLY)
CD4 % Helper T Cell: 14 % — ABNORMAL LOW (ref 33–55)
CD4 T Cell Abs: 450 /uL (ref 400–2700)

## 2014-04-01 LAB — HIV-1 RNA QUANT-NO REFLEX-BLD
HIV 1 RNA Quant: <20 copies/mL (ref <20)
HIV-1 RNA Quant, Log: <1.3 {Log} (ref <1.30)

## 2014-04-13 ENCOUNTER — Ambulatory Visit (INDEPENDENT_AMBULATORY_CARE_PROVIDER_SITE_OTHER): Payer: Self-pay | Admitting: Internal Medicine

## 2014-04-13 VITALS — BP 148/80 | HR 80 | Temp 98.4°F | Wt 320.0 lb

## 2014-04-13 DIAGNOSIS — R87810 Cervical high risk human papillomavirus (HPV) DNA test positive: Secondary | ICD-10-CM

## 2014-04-13 DIAGNOSIS — B20 Human immunodeficiency virus [HIV] disease: Secondary | ICD-10-CM

## 2014-04-13 DIAGNOSIS — R87610 Atypical squamous cells of undetermined significance on cytologic smear of cervix (ASC-US): Secondary | ICD-10-CM

## 2014-04-13 NOTE — Assessment & Plan Note (Signed)
She is doing well on her salvage regimen will continue. We'll have her return in 4 months with labs before.

## 2014-04-13 NOTE — Progress Notes (Signed)
  Subjective:    Patient ID: Ariel Cox, female    DOB: 1970/05/04, 44 y.o.   MRN: [id number]  HPI She was started on Complera in June 2012 but never followed up again. 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 She has a history of treatment of Kaletra and Combivir in the past. She was started on Combivir, boosted Prezista, Isentress. She is tolerating it well, no missed doses. She is pleased with the regimen.  She continues to be undetectable.     Review of Systems  Constitutional: Negative for fever and fatigue.  HENT: Negative for sore throat and trouble swallowing.   Respiratory: Negative for cough and shortness of breath.   Cardiovascular: Negative for chest pain and leg swelling.  Gastrointestinal: Negative for nausea, abdominal pain and diarrhea.  Musculoskeletal: Negative for myalgias and arthralgias.  Skin: Negative for rash.  Neurological: Negative for dizziness, light-headedness and headaches.  Hematological: Negative for adenopathy.  Psychiatric/Behavioral: Negative for dysphoric mood.       Objective:   Physical Exam  Constitutional: She appears well-developed and well-nourished. No distress.  HENT:  Mouth/Throat: Oropharynx is clear and moist. No oropharyngeal exudate.  Eyes: Right eye exhibits no discharge. Left eye exhibits no discharge. No scleral icterus.  Cardiovascular: Normal rate, regular rhythm and normal heart sounds.   No murmur heard. Pulmonary/Chest: Effort normal and breath sounds normal. No respiratory distress. She has no wheezes.  Lymphadenopathy:    She has no cervical adenopathy.  Skin: Skin is warm and dry. No rash noted.          Assessment & Plan:

## 2014-04-13 NOTE — Assessment & Plan Note (Signed)
She is going to schedule with her gynecologist again.

## 2014-04-21 ENCOUNTER — Encounter: Payer: Self-pay | Admitting: Obstetrics & Gynecology

## 2014-04-21 ENCOUNTER — Other Ambulatory Visit (HOSPITAL_COMMUNITY): Admission: RE | Admit: 2014-04-21 | Payer: MEDICAID | Source: Ambulatory Visit | Admitting: Obstetrics & Gynecology

## 2014-04-21 ENCOUNTER — Ambulatory Visit (INDEPENDENT_AMBULATORY_CARE_PROVIDER_SITE_OTHER): Payer: Self-pay | Admitting: Obstetrics & Gynecology

## 2014-04-21 VITALS — BP 155/83 | HR 92 | Ht 67.0 in | Wt 306.6 lb

## 2014-04-21 DIAGNOSIS — Z01419 Encounter for gynecological examination (general) (routine) without abnormal findings: Secondary | ICD-10-CM

## 2014-04-21 DIAGNOSIS — Z8742 Personal history of other diseases of the female genital tract: Secondary | ICD-10-CM

## 2014-04-21 DIAGNOSIS — Z124 Encounter for screening for malignant neoplasm of cervix: Secondary | ICD-10-CM

## 2014-04-21 DIAGNOSIS — Z1151 Encounter for screening for human papillomavirus (HPV): Secondary | ICD-10-CM

## 2014-04-21 DIAGNOSIS — Z118 Encounter for screening for other infectious and parasitic diseases: Secondary | ICD-10-CM

## 2014-04-21 DIAGNOSIS — Z0000 Encounter for general adult medical examination without abnormal findings: Secondary | ICD-10-CM

## 2014-04-21 DIAGNOSIS — Z113 Encounter for screening for infections with a predominantly sexual mode of transmission: Secondary | ICD-10-CM

## 2014-04-21 NOTE — Progress Notes (Signed)
   Subjective:    Patient ID: Ariel Cox, female    DOB: 05-Oct-1970, 44 y.o.   MRN: [id number]  HPI  44 yo morbidly obese HIV+ lady is here for her 1 month f/u after being treated for PID and +GC. She reports that her pain is markedly decreased but that she does have some RLQ discomfort for which she takes IBU prn.   Review of Systems Her last pap was 1/15 and showed ASCUS +HR HPV.    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

## 2014-04-24 LAB — CYTOLOGY - PAP

## 2014-05-01 ENCOUNTER — Telehealth: Payer: Self-pay | Admitting: *Deleted

## 2014-05-01 NOTE — Telephone Encounter (Addendum)
Per results chart review pap was As-cus. Need plan for follow up if needed. Pap done with Dr. Marice Potterove. Will route to Dr. Marice Potterove for plan of care.

## 2014-05-02 NOTE — Telephone Encounter (Signed)
Called patient, no answer- left message stating we are trying to reach you, please call us back at the clinics option 3 for nurse line. Patient needs colpo appt

## 2014-05-02 NOTE — Telephone Encounter (Signed)
Pt returned call and I explained to the pt her results of her pap smear and the need for a colposcopy.  Pt stated understanding.  [place], front office,  scheduled colpo appt.

## 2014-05-02 NOTE — Telephone Encounter (Signed)
Per Dr Dove, patient needs colpo 

## 2014-05-06 ENCOUNTER — Other Ambulatory Visit: Payer: Self-pay | Admitting: Internal Medicine

## 2014-05-08 ENCOUNTER — Other Ambulatory Visit: Payer: Self-pay | Admitting: Obstetrics and Gynecology

## 2014-05-08 DIAGNOSIS — Z1231 Encounter for screening mammogram for malignant neoplasm of breast: Secondary | ICD-10-CM

## 2014-05-25 ENCOUNTER — Ambulatory Visit (HOSPITAL_COMMUNITY)
Admission: RE | Admit: 2014-05-25 | Discharge: 2014-05-25 | Disposition: A | Discharge status: Home / Self Care | Payer: Medicaid Other | Source: Ambulatory Visit | Attending: Obstetrics and Gynecology | Admitting: Obstetrics and Gynecology

## 2014-05-25 ENCOUNTER — Encounter (HOSPITAL_COMMUNITY): Payer: Self-pay

## 2014-05-25 VITALS — BP 116/82 | Temp 98.5°F | Ht 67.0 in | Wt 305.8 lb

## 2014-05-25 DIAGNOSIS — Z1239 Encounter for other screening for malignant neoplasm of breast: Secondary | ICD-10-CM

## 2014-05-25 DIAGNOSIS — R87610 Atypical squamous cells of undetermined significance on cytologic smear of cervix (ASC-US): Secondary | ICD-10-CM

## 2014-05-25 DIAGNOSIS — Z1231 Encounter for screening mammogram for malignant neoplasm of breast: Secondary | ICD-10-CM

## 2014-05-25 DIAGNOSIS — R87810 Cervical high risk human papillomavirus (HPV) DNA test positive: Secondary | ICD-10-CM

## 2014-05-25 NOTE — Patient Instructions (Signed)
Education materials on self-breast awareness given. Explained to [name] the colposcopy that is needed for follow-up of abnormal Pap smear on 04/21/2014. [facility] to follow-up for abnormal Pap smear. Appointment scheduled for Wednesday, May 31, 2014 at 1415. Patient aware of appointment and will be there. Let patient know the [facility] will follow-up with her within the next couple of weeks with results of mammogram by letter or phone. [name] verbalized understanding. Patient escorted to mammography for a screening mammogram.  Joleene Burnham, Kathaleen Maserhristine Poll, RN 3:50 PM

## 2014-05-25 NOTE — Progress Notes (Signed)
[facility] due to recommending a colposcopy to follow-up for abnormal Pap smear 04/21/2014.  Pap Smear: Pap smear not completed today. [facility] and ASCUS HPV+. [facility] for a colposcopy to follow-up for abnormal Pap smear. Appointment scheduled for Wednesday, May 31, 2014 at 1415. Per patient has a history of two abnormal Pap smears. 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 Per patient has a history of an abnormal Pap smear 10 years ago that required a colposcopy for follow-up. Last two Pap smear results and last colposcopy result is in EPIC.  Physical exam: Breasts Breasts symmetrical. No skin abnormalities bilateral breasts. No nipple retraction bilateral breasts. No nipple discharge bilateral breasts. No lymphadenopathy. No lumps palpated bilateral breasts. No complaints of pain or tenderness on exam. Patient escorted to mammography for a screening mammogram.        Pelvic/Bimanual No Pap smear completed today since last Pap smear was 04/21/2014. Pap smear not indicated per [facility]CP guidelines.

## 2014-05-31 ENCOUNTER — Encounter: Payer: Self-pay | Admitting: Obstetrics and Gynecology

## 2014-05-31 ENCOUNTER — Other Ambulatory Visit (HOSPITAL_COMMUNITY)
Admission: RE | Admit: 2014-05-31 | Discharge: 2014-05-31 | Disposition: A | Discharge status: Home / Self Care | Payer: Medicaid Other | Source: Ambulatory Visit | Attending: Obstetrics and Gynecology | Admitting: Obstetrics and Gynecology

## 2014-05-31 ENCOUNTER — Ambulatory Visit (INDEPENDENT_AMBULATORY_CARE_PROVIDER_SITE_OTHER): Payer: Self-pay | Admitting: Obstetrics and Gynecology

## 2014-05-31 VITALS — BP 112/48 | HR 82 | Temp 98.6°F | Resp 20 | Ht 67.0 in | Wt 303.8 lb

## 2014-05-31 DIAGNOSIS — R87618 Other abnormal cytological findings on specimens from cervix uteri: Secondary | ICD-10-CM | POA: Diagnosis not present

## 2014-05-31 DIAGNOSIS — Z3202 Encounter for pregnancy test, result negative: Secondary | ICD-10-CM

## 2014-05-31 DIAGNOSIS — R87610 Atypical squamous cells of undetermined significance on cytologic smear of cervix (ASC-US): Secondary | ICD-10-CM

## 2014-05-31 DIAGNOSIS — Z01812 Encounter for preprocedural laboratory examination: Secondary | ICD-10-CM

## 2014-05-31 DIAGNOSIS — R87810 Cervical high risk human papillomavirus (HPV) DNA test positive: Secondary | ICD-10-CM

## 2014-05-31 LAB — POCT PREGNANCY, URINE: Preg Test, Ur: NEGATIVE

## 2014-05-31 NOTE — Progress Notes (Signed)
Patient ID: [name], female   DOB: [birth year], 44 y.o.   MRN: [id number] 44 yo HIV positive with abnormal pap smear on 04/21/2014 ASCUS +HPV who is here for a colposcopy. 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  Placed in lithotomy position. Cervix viewed with speculum and colposcope after application of acetic acid.   Colposcopy adequate?  No TZ not visualized  Acetowhite lesions?yes 12 and 3 o'clock Punctation?no Mosaicism?  no Abnormal vasculature?  no Biopsies?yes 12 and 3 o'clock ECC?yes  COMMENTS: Patient was given post procedure instructions.  She will return in 2 weeks for results.

## 2014-06-05 ENCOUNTER — Telehealth: Payer: Self-pay | Admitting: *Deleted

## 2014-06-05 NOTE — Telephone Encounter (Signed)
-----   Message from Catalina AntiguaPeggy Constant, MD sent at 06/05/2014 10:49 AM EDT ----- Patient needs a LEEP. Please inform patient of abnormal colpo and schedule leep

## 2014-06-05 NOTE — Telephone Encounter (Signed)
Attempted to contact patient to discuss colpo results and need for leep, no answer.  Left message for patient to contact the clinic for results.  Message sent to front office to schedule LEEP appointment.

## 2014-06-05 NOTE — Telephone Encounter (Signed)
Patient called and left message stating she is returning our call about her test results. Called patient, no answer- left message stating we are trying to return your phone call, please call us back at the clinics

## 2014-06-06 NOTE — Telephone Encounter (Signed)
Patient returned call and stated her voicemail is a secure line and results can be left on voicemail. Called patient and informed of results and discussed LEEP. Informed her front office staff will be calling her with an appointment-- advised she come 30 minutes early to appointment to watch video. Patient verbalized understanding. No questions or concerns at this time.

## 2014-06-20 ENCOUNTER — Telehealth: Payer: Self-pay | Admitting: *Deleted

## 2014-06-20 NOTE — Telephone Encounter (Signed)
Pt contacted the clinic requesting information concerning LEEP appointment.  Contacted patient, pt has questions concerning LEEP procedure, questions answered.  Pt verbalizes understanding and has no further questions.

## 2014-06-23 ENCOUNTER — Encounter: Payer: Self-pay | Admitting: Obstetrics and Gynecology

## 2014-06-23 ENCOUNTER — Ambulatory Visit (INDEPENDENT_AMBULATORY_CARE_PROVIDER_SITE_OTHER): Payer: Self-pay | Admitting: Obstetrics and Gynecology

## 2014-06-23 ENCOUNTER — Other Ambulatory Visit (HOSPITAL_COMMUNITY)
Admission: RE | Admit: 2014-06-23 | Discharge: 2014-06-23 | Disposition: A | Discharge status: Home / Self Care | Payer: Medicaid Other | Source: Ambulatory Visit | Attending: Obstetrics and Gynecology | Admitting: Obstetrics and Gynecology

## 2014-06-23 VITALS — BP 124/80 | HR 79 | Temp 98.5°F | Ht 66.5 in | Wt 299.4 lb

## 2014-06-23 DIAGNOSIS — R87810 Cervical high risk human papillomavirus (HPV) DNA test positive: Secondary | ICD-10-CM

## 2014-06-23 DIAGNOSIS — R87619 Unspecified abnormal cytological findings in specimens from cervix uteri: Secondary | ICD-10-CM | POA: Insufficient documentation

## 2014-06-23 DIAGNOSIS — Z3202 Encounter for pregnancy test, result negative: Secondary | ICD-10-CM

## 2014-06-23 DIAGNOSIS — R87610 Atypical squamous cells of undetermined significance on cytologic smear of cervix (ASC-US): Secondary | ICD-10-CM

## 2014-06-23 DIAGNOSIS — Z01812 Encounter for preprocedural laboratory examination: Secondary | ICD-10-CM

## 2014-06-23 LAB — POCT PREGNANCY, URINE: Preg Test, Ur: NEGATIVE

## 2014-06-23 NOTE — Progress Notes (Signed)
Patient ID: [name], female   DOB: [birth year], 44 y.o.   MRN: [id number] Patient identified, informed consent obtained, signed copy in chart, time out performed.  Pap smear and colposcopy reviewed.   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  Cervix visualized. Paracervical block placed.  Large size LOOP used to remove cone of cervix using blend of cut and cautery on LEEP machine.  Edges/Base cauterized with Ball.  Monsel's solution used for hemostasis.  Patient tolerated procedure well.  Patient given post procedure instructions.  Follow up in 6 months for repeat pap or as needed.

## 2014-06-26 ENCOUNTER — Telehealth: Payer: Self-pay

## 2014-06-26 NOTE — Telephone Encounter (Signed)
-----   Message from Catalina AntiguaPeggy Constant, MD sent at 06/26/2014 12:12 PM EDT ----- Please inform patient that cone biopsy seems to have all the abnormal cells. She needs to follow up in 6 months for repeat pap smear  Thanks  Gigi GinPeggy

## 2014-06-26 NOTE — Telephone Encounter (Signed)
Called patient and informed her of results and recommendations. Advised she call clinic in early October 2016 to schedule appointment for pap in November. Patient verbalized understanding. No questions or concerns.

## 2014-07-02 ENCOUNTER — Other Ambulatory Visit: Payer: Self-pay | Admitting: Internal Medicine

## 2014-08-03 ENCOUNTER — Other Ambulatory Visit: Payer: Self-pay | Admitting: Internal Medicine

## 2014-08-03 DIAGNOSIS — B20 Human immunodeficiency virus [HIV] disease: Secondary | ICD-10-CM

## 2014-08-03 MED ORDER — DARUNAVIR ETHANOLATE 800 MG PO TABS: ORAL_TABLET | ORAL | Status: DC

## 2014-08-03 MED ORDER — RALTEGRAVIR POTASSIUM 400 MG PO TABS: ORAL_TABLET | ORAL | Status: DC

## 2014-08-03 MED ORDER — LAMIVUDINE-ZIDOVUDINE 150-300 MG PO TABS: 1.0000 | ORAL_TABLET | Freq: Two times a day (BID) | ORAL | Status: DC

## 2014-08-10 ENCOUNTER — Other Ambulatory Visit: Payer: Self-pay

## 2014-08-14 ENCOUNTER — Other Ambulatory Visit: Payer: Self-pay

## 2014-08-14 DIAGNOSIS — B20 Human immunodeficiency virus [HIV] disease: Secondary | ICD-10-CM

## 2014-08-15 LAB — T-HELPER CELL (CD4) - (RCID CLINIC ONLY)
CD4 % Helper T Cell: 14 % — ABNORMAL LOW (ref 33–55)
CD4 T Cell Abs: 290 /uL — ABNORMAL LOW (ref 400–2700)

## 2014-08-15 LAB — HIV-1 RNA QUANT-NO REFLEX-BLD
HIV 1 RNA Quant: <20 {copies}/mL
HIV-1 RNA Quant, Log: <1.3 {Log}

## 2014-08-24 ENCOUNTER — Ambulatory Visit (INDEPENDENT_AMBULATORY_CARE_PROVIDER_SITE_OTHER): Payer: Medicaid Other | Admitting: Internal Medicine

## 2014-08-24 ENCOUNTER — Encounter: Payer: Self-pay | Admitting: Internal Medicine

## 2014-08-24 VITALS — BP 127/88 | HR 80 | Temp 98.4°F | Wt 302.0 lb

## 2014-08-24 DIAGNOSIS — Z79899 Other long term (current) drug therapy: Secondary | ICD-10-CM | POA: Diagnosis not present

## 2014-08-24 DIAGNOSIS — B20 Human immunodeficiency virus [HIV] disease: Secondary | ICD-10-CM | POA: Diagnosis present

## 2014-08-24 DIAGNOSIS — Z113 Encounter for screening for infections with a predominantly sexual mode of transmission: Secondary | ICD-10-CM | POA: Diagnosis not present

## 2014-08-24 NOTE — Progress Notes (Signed)
  Subjective:    Patient ID: Ariel Cox, female    DOB: 31-Jul-1970, 44 y.o.   MRN: [id number]  HPI She was started on Complera in June 2012 but never followed up again. 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 She has a history of treatment of Kaletra and Combivir in the past. She was started on Combivir, boosted Prezista, Isentress. She is tolerating it well, no missed doses. She is pleased with the regimen.  She continues to be undetectable and has been for 3 years.  Is seeing Gyn for her ascus.     Review of Systems  Constitutional: Negative for fever and fatigue.  HENT: Negative for sore throat and trouble swallowing.   Respiratory: Negative for cough and shortness of breath.   Cardiovascular: Negative for chest pain and leg swelling.  Gastrointestinal: Negative for nausea, abdominal pain and diarrhea.  Musculoskeletal: Negative for myalgias and arthralgias.  Skin: Negative for rash.  Neurological: Negative for dizziness, light-headedness and headaches.  Hematological: Negative for adenopathy.  Psychiatric/Behavioral: Negative for dysphoric mood.       Objective:   Physical Exam  Constitutional: She appears well-developed and well-nourished. No distress.  HENT:  Mouth/Throat: Oropharynx is clear and moist. No oropharyngeal exudate.  Eyes: Right eye exhibits no discharge. Left eye exhibits no discharge. No scleral icterus.  Cardiovascular: Normal rate, regular rhythm and normal heart sounds.   No murmur heard. Pulmonary/Chest: Effort normal and breath sounds normal. No respiratory distress. She has no wheezes.  Lymphadenopathy:    She has no cervical adenopathy.  Skin: Skin is warm and dry. No rash noted.          Assessment & Plan:

## 2014-08-24 NOTE — Assessment & Plan Note (Signed)
Will continue with the same but will check HLA test next visit and consider Tivicay, abacavir and prezista/r or Triumeq and Prezcobix.

## 2014-10-28 ENCOUNTER — Other Ambulatory Visit: Payer: Self-pay | Admitting: Internal Medicine

## 2014-12-28 ENCOUNTER — Other Ambulatory Visit (INDEPENDENT_AMBULATORY_CARE_PROVIDER_SITE_OTHER): Payer: Medicaid Other

## 2014-12-28 DIAGNOSIS — B20 Human immunodeficiency virus [HIV] disease: Secondary | ICD-10-CM | POA: Diagnosis not present

## 2014-12-28 DIAGNOSIS — Z79899 Other long term (current) drug therapy: Secondary | ICD-10-CM

## 2014-12-28 DIAGNOSIS — Z113 Encounter for screening for infections with a predominantly sexual mode of transmission: Secondary | ICD-10-CM

## 2014-12-28 LAB — CBC WITH DIFFERENTIAL/PLATELET
Basophils Absolute: 0 10*3/uL (ref 0.0–0.1)
Basophils Relative: 0 % (ref 0–1)
Eosinophils Absolute: 0.1 10*3/uL (ref 0.0–0.7)
Eosinophils Relative: 2 % (ref 0–5)
HCT: 33.9 % — ABNORMAL LOW (ref 36.0–46.0)
Hemoglobin: 11 g/dL — ABNORMAL LOW (ref 12.0–15.0)
Lymphocytes Relative: 39 % (ref 12–46)
Lymphs Abs: 1.6 10*3/uL (ref 0.7–4.0)
MCH: 29.3 pg (ref 26.0–34.0)
MCHC: 32.4 g/dL (ref 30.0–36.0)
MCV: 90.2 fL (ref 78.0–100.0)
MPV: 8.7 fL (ref 8.6–12.4)
Monocytes Absolute: 0.5 10*3/uL (ref 0.1–1.0)
Monocytes Relative: 13 % — ABNORMAL HIGH (ref 3–12)
Neutro Abs: 1.9 10*3/uL (ref 1.7–7.7)
Neutrophils Relative %: 46 % (ref 43–77)
Platelets: 374 10*3/uL (ref 150–400)
RBC: 3.76 MIL/uL — ABNORMAL LOW (ref 3.87–5.11)
RDW: 14.7 % (ref 11.5–15.5)
WBC: 4.2 10*3/uL (ref 4.0–10.5)

## 2014-12-28 LAB — COMPLETE METABOLIC PANEL WITH GFR
ALT: 10 U/L (ref 6–29)
AST: 15 U/L (ref 10–35)
Albumin: 3.9 g/dL (ref 3.6–5.1)
Alkaline Phosphatase: 76 U/L (ref 33–115)
BUN: 12 mg/dL (ref 7–25)
CO2: 26 mmol/L (ref 20–31)
Calcium: 8.3 mg/dL — ABNORMAL LOW (ref 8.6–10.2)
Chloride: 105 mmol/L (ref 98–110)
Creat: 0.96 mg/dL (ref 0.50–1.10)
GFR, Est African American: 83 mL/min (ref >=60)
GFR, Est Non African American: 72 mL/min (ref >=60)
Glucose, Bld: 102 mg/dL — ABNORMAL HIGH (ref 65–99)
Potassium: 4.1 mmol/L (ref 3.5–5.3)
Sodium: 138 mmol/L (ref 135–146)
Total Bilirubin: 0.3 mg/dL (ref 0.2–1.2)
Total Protein: 7.4 g/dL (ref 6.1–8.1)

## 2014-12-28 LAB — LIPID PANEL
Cholesterol: 202 mg/dL — ABNORMAL HIGH (ref 125–200)
HDL: 49 mg/dL (ref >=46)
LDL Cholesterol: 137 mg/dL — ABNORMAL HIGH (ref <130)
Total CHOL/HDL Ratio: 4.1 Ratio (ref <=5.0)
Triglycerides: 80 mg/dL (ref <150)
VLDL: 16 mg/dL (ref <30)

## 2014-12-29 LAB — T-HELPER CELL (CD4) - (RCID CLINIC ONLY)
CD4 % Helper T Cell: 15 % — ABNORMAL LOW (ref 33–55)
CD4 T Cell Abs: 240 /uL — ABNORMAL LOW (ref 400–2700)

## 2014-12-29 LAB — RPR

## 2014-12-29 LAB — HIV-1 RNA QUANT-NO REFLEX-BLD
HIV 1 RNA Quant: <20 copies/mL (ref <20)
HIV-1 RNA Quant, Log: <1.3 Log copies/mL (ref <1.30)

## 2015-01-04 LAB — HLA B*5701: HLA-B*5701 w/rflx HLA-B High: NEGATIVE

## 2015-01-10 ENCOUNTER — Ambulatory Visit: Payer: Medicaid Other | Admitting: Internal Medicine

## 2015-01-15 ENCOUNTER — Encounter: Payer: Self-pay | Admitting: Internal Medicine

## 2015-01-15 ENCOUNTER — Ambulatory Visit (INDEPENDENT_AMBULATORY_CARE_PROVIDER_SITE_OTHER): Payer: Medicaid Other | Admitting: Internal Medicine

## 2015-01-15 VITALS — BP 128/84 | HR 79 | Temp 98.0°F | Ht 66.5 in | Wt 294.0 lb

## 2015-01-15 DIAGNOSIS — B20 Human immunodeficiency virus [HIV] disease: Secondary | ICD-10-CM | POA: Diagnosis not present

## 2015-01-15 DIAGNOSIS — B86 Scabies: Secondary | ICD-10-CM

## 2015-01-15 DIAGNOSIS — F329 Major depressive disorder, single episode, unspecified: Secondary | ICD-10-CM

## 2015-01-15 DIAGNOSIS — F32A Depression, unspecified: Secondary | ICD-10-CM

## 2015-01-15 MED ORDER — DARUNAVIR-COBICISTAT 800-150 MG PO TABS: 1.0000 | ORAL_TABLET | Freq: Every day | ORAL | Status: DC

## 2015-01-15 MED ORDER — PERMETHRIN 5 % EX CREA
1.0000 | TOPICAL_CREAM | Freq: Once | CUTANEOUS | Status: DC
Start: 2015-01-15 — End: 2015-03-31

## 2015-01-15 MED ORDER — ABACAVIR-DOLUTEGRAVIR-LAMIVUD 600-50-300 MG PO TABS: 1.0000 | ORAL_TABLET | Freq: Every day | ORAL | Status: DC

## 2015-01-15 NOTE — Assessment & Plan Note (Signed)
Will streamline her medications today to a less toxic regimen overall.  RTC in 2 months for labs and after with me. Discussed regimen

## 2015-01-15 NOTE — Progress Notes (Signed)
  Subjective:    Patient ID: Ariel Cox, female    DOB: 12/22/1970, 44 y.o.   MRN: [id number]  HPI  Here for follow up of HIV.   She was started on Complera in June 2012 but never followed up again. When she returned > 6 months later, a genotype showed multiple resistance mutations including K70E,V90I,A98G,V108I,E138G/K/R,Y181C,M184V. She has a history of treatment of Kaletra and Combivir in the past. She was started on Combivir, boosted Prezista, Isentress. She is tolerating it well, no missed doses. She is pleased with the regimen.  I did HLA testing and she is negative.  Recent CD4 240, viral load remains undetectable now over 3 years.   Has had a recent exposure to unknown bites thought to be from the carpet.  Hgb a bit improved.      Review of Systems  Constitutional: Negative for fatigue.  HENT: Negative for trouble swallowing.   Gastrointestinal: Negative for nausea and diarrhea.  Skin: Positive for rash.       puritic  Neurological: Negative for dizziness, light-headedness and headaches.  Hematological: Negative for adenopathy.  Psychiatric/Behavioral: Negative for dysphoric mood.       Objective:   Physical Exam  Constitutional: She appears well-developed and well-nourished. No distress.  HENT:  Mouth/Throat: Oropharynx is clear and moist. No oropharyngeal exudate.  Eyes: Right eye exhibits no discharge. Left eye exhibits no discharge. No scleral icterus.  Cardiovascular: Normal rate, regular rhythm and normal heart sounds.   No murmur heard. Pulmonary/Chest: Effort normal and breath sounds normal. No respiratory distress. She has no wheezes.  Lymphadenopathy:    She has no cervical adenopathy.  Skin:  + multiple pruritic lesions on arms  Vitals reviewed.   Social History   Social History  . Marital Status: Single    Spouse Name: N/A  . Number of Children: N/A  . Years of Education: N/A   Occupational History  . Not on file.   Social History Main Topics   . Smoking status: Never Smoker   . Smokeless tobacco: Never Used  . Alcohol Use: Yes     Comment: rare  . Drug Use: No  . Sexual Activity:    Partners: Male    Patent examiner Protection: None     Comment: pt. given condoms   Other Topics Concern  . Not on file   Social History Narrative        Assessment & Plan:

## 2015-01-15 NOTE — Assessment & Plan Note (Signed)
Stable, denies SI.  Not interested in therapy

## 2015-01-15 NOTE — Assessment & Plan Note (Signed)
Not sure if active scabies or not but will try permethrin to be sure.

## 2015-03-15 ENCOUNTER — Other Ambulatory Visit: Payer: Medicaid Other

## 2015-03-15 ENCOUNTER — Other Ambulatory Visit (HOSPITAL_COMMUNITY)
Admission: RE | Admit: 2015-03-15 | Discharge: 2015-03-15 | Disposition: A | Discharge status: Home / Self Care | Payer: Medicaid Other | Source: Ambulatory Visit | Attending: Internal Medicine | Admitting: Internal Medicine

## 2015-03-15 DIAGNOSIS — B20 Human immunodeficiency virus [HIV] disease: Secondary | ICD-10-CM

## 2015-03-15 DIAGNOSIS — Z113 Encounter for screening for infections with a predominantly sexual mode of transmission: Secondary | ICD-10-CM | POA: Insufficient documentation

## 2015-03-15 LAB — CBC WITH DIFFERENTIAL/PLATELET
Basophils Absolute: 0 10*3/uL (ref 0.0–0.1)
Basophils Relative: 0 % (ref 0–1)
Eosinophils Absolute: 0.1 10*3/uL (ref 0.0–0.7)
Eosinophils Relative: 1 % (ref 0–5)
HCT: 31.2 % — ABNORMAL LOW (ref 36.0–46.0)
Hemoglobin: 10.2 g/dL — ABNORMAL LOW (ref 12.0–15.0)
Lymphocytes Relative: 26 % (ref 12–46)
Lymphs Abs: 1.6 10*3/uL (ref 0.7–4.0)
MCH: 28.4 pg (ref 26.0–34.0)
MCHC: 32.7 g/dL (ref 30.0–36.0)
MCV: 86.9 fL (ref 78.0–100.0)
MPV: 8.8 fL (ref 8.6–12.4)
Monocytes Absolute: 0.9 10*3/uL (ref 0.1–1.0)
Monocytes Relative: 15 % — ABNORMAL HIGH (ref 3–12)
Neutro Abs: 3.6 10*3/uL (ref 1.7–7.7)
Neutrophils Relative %: 58 % (ref 43–77)
Platelets: 322 10*3/uL (ref 150–400)
RBC: 3.59 MIL/uL — ABNORMAL LOW (ref 3.87–5.11)
RDW: 14.7 % (ref 11.5–15.5)
WBC: 6.2 10*3/uL (ref 4.0–10.5)

## 2015-03-15 LAB — COMPLETE METABOLIC PANEL WITH GFR
ALT: 11 U/L (ref 6–29)
AST: 15 U/L (ref 10–35)
Albumin: 3.5 g/dL — ABNORMAL LOW (ref 3.6–5.1)
Alkaline Phosphatase: 65 U/L (ref 33–115)
BUN: 9 mg/dL (ref 7–25)
CO2: 19 mmol/L — ABNORMAL LOW (ref 20–31)
Calcium: 8.2 mg/dL — ABNORMAL LOW (ref 8.6–10.2)
Chloride: 109 mmol/L (ref 98–110)
Creat: 1.17 mg/dL — ABNORMAL HIGH (ref 0.50–1.10)
GFR, Est African American: 65 mL/min (ref >=60)
GFR, Est Non African American: 56 mL/min — ABNORMAL LOW (ref >=60)
Glucose, Bld: 78 mg/dL (ref 65–99)
Potassium: 4.3 mmol/L (ref 3.5–5.3)
Sodium: 136 mmol/L (ref 135–146)
Total Bilirubin: 0.4 mg/dL (ref 0.2–1.2)
Total Protein: 6.8 g/dL (ref 6.1–8.1)

## 2015-03-15 NOTE — Addendum Note (Signed)
Addended by: Mariea Clonts D on: 03/15/2015 02:33 PM   Modules accepted: Orders

## 2015-03-16 LAB — T-HELPER CELL (CD4) - (RCID CLINIC ONLY)
CD4 % Helper T Cell: 16 % — ABNORMAL LOW (ref 33–55)
CD4 T Cell Abs: 300 /uL — ABNORMAL LOW (ref 400–2700)

## 2015-03-16 LAB — URINE CYTOLOGY ANCILLARY ONLY
Chlamydia: NEGATIVE
Neisseria Gonorrhea: NEGATIVE

## 2015-03-16 LAB — HIV-1 RNA QUANT-NO REFLEX-BLD
HIV 1 RNA Quant: <20 copies/mL (ref <20)
HIV-1 RNA Quant, Log: <1.3 Log copies/mL (ref <1.30)

## 2015-03-29 ENCOUNTER — Other Ambulatory Visit: Payer: Medicaid Other

## 2015-03-29 ENCOUNTER — Encounter: Payer: Self-pay | Admitting: Internal Medicine

## 2015-03-29 ENCOUNTER — Ambulatory Visit (INDEPENDENT_AMBULATORY_CARE_PROVIDER_SITE_OTHER): Payer: Medicaid Other | Admitting: Internal Medicine

## 2015-03-29 VITALS — BP 139/92 | HR 71 | Temp 97.4°F | Wt 300.0 lb

## 2015-03-29 DIAGNOSIS — R87610 Atypical squamous cells of undetermined significance on cytologic smear of cervix (ASC-US): Secondary | ICD-10-CM | POA: Diagnosis not present

## 2015-03-29 DIAGNOSIS — B20 Human immunodeficiency virus [HIV] disease: Secondary | ICD-10-CM | POA: Diagnosis not present

## 2015-03-29 DIAGNOSIS — R87810 Cervical high risk human papillomavirus (HPV) DNA test positive: Secondary | ICD-10-CM | POA: Diagnosis not present

## 2015-03-29 DIAGNOSIS — I10 Essential (primary) hypertension: Secondary | ICD-10-CM

## 2015-03-31 NOTE — Assessment & Plan Note (Signed)
A little elevated today but will continue with the same dose and monitor.  I encouraged her to get a PCP.

## 2015-03-31 NOTE — Assessment & Plan Note (Signed)
To call her gynecologist to get back in.

## 2015-03-31 NOTE — Assessment & Plan Note (Signed)
Doing great on her salvage regimen.  RTC 4 months.   

## 2015-03-31 NOTE — Progress Notes (Signed)
  Subjective:    Patient ID: Ariel Cox, female    DOB: 1970/12/01, 45 y.o.   MRN: [id number]  HPI  Here for follow up of HIV.   She was started on Complera in June 2012 but never followed up again. When she returned > 6 months later, a genotype showed multiple resistance mutations including K70E,V90I,A98G,V108I,E138G/K/R,Y181C,M184V. She has a history of treatment of Kaletra and Combivir in the past. She was started on Combivir, boosted Prezista, Isentress. I then changed her to Triumeq and Prezcobix which she is tolerating well.  No missed doses.  CD4 300, viral load remains undetectable now over 3 years.  [facility].     Review of Systems  Constitutional: Negative for fatigue.  HENT: Negative for trouble swallowing.   Gastrointestinal: Negative for diarrhea.  Skin: Negative for rash.  Neurological: Negative for light-headedness.       Objective:   Physical Exam  Constitutional: She appears well-developed and well-nourished. No distress.  HENT:  Mouth/Throat: Oropharynx is clear and moist. No oropharyngeal exudate.  Eyes: Right eye exhibits no discharge. Left eye exhibits no discharge. No scleral icterus.  Cardiovascular: Normal rate, regular rhythm and normal heart sounds.   No murmur heard. Pulmonary/Chest: Effort normal and breath sounds normal.  Lymphadenopathy:    She has no cervical adenopathy.  Vitals reviewed.   Social History   Social History  . Marital Status: Single    Spouse Name: N/A  . Number of Children: N/A  . Years of Education: N/A   Occupational History  . Not on file.   Social History Main Topics  . Smoking status: Never Smoker   . Smokeless tobacco: Never Used  . Alcohol Use: Yes     Comment: rare  . Drug Use: No  . Sexual Activity:    Partners: Male    Pharmacist, hospital Protection: None     Comment: pt. given condoms   Other Topics Concern  . Not on file   Social History Narrative        Assessment &  Plan:

## 2015-07-04 ENCOUNTER — Other Ambulatory Visit: Payer: Self-pay | Admitting: *Deleted

## 2015-07-04 MED ORDER — DARUNAVIR-COBICISTAT 800-150 MG PO TABS: 1.0000 | ORAL_TABLET | Freq: Every day | ORAL | Status: DC

## 2015-07-04 MED ORDER — ABACAVIR-DOLUTEGRAVIR-LAMIVUD 600-50-300 MG PO TABS: 1.0000 | ORAL_TABLET | Freq: Every day | ORAL | Status: DC

## 2015-08-01 ENCOUNTER — Encounter: Payer: Self-pay | Admitting: Internal Medicine

## 2015-08-31 ENCOUNTER — Other Ambulatory Visit: Payer: Self-pay | Admitting: Internal Medicine

## 2015-08-31 DIAGNOSIS — B20 Human immunodeficiency virus [HIV] disease: Secondary | ICD-10-CM

## 2015-10-24 ENCOUNTER — Other Ambulatory Visit: Payer: Self-pay | Admitting: Internal Medicine

## 2015-11-26 ENCOUNTER — Other Ambulatory Visit: Payer: Self-pay

## 2015-11-26 DIAGNOSIS — B20 Human immunodeficiency virus [HIV] disease: Secondary | ICD-10-CM

## 2015-11-27 LAB — HIV-1 RNA QUANT-NO REFLEX-BLD
HIV 1 RNA Quant: <20 copies/mL (ref <20)
HIV-1 RNA Quant, Log: <1.3 Log copies/mL (ref <1.30)

## 2015-11-28 LAB — T-HELPER CELL (CD4) - (RCID CLINIC ONLY)
CD4 % Helper T Cell: 18 % — ABNORMAL LOW (ref 33–55)
CD4 T Cell Abs: 430 /uL (ref 400–2700)

## 2015-12-04 ENCOUNTER — Encounter: Payer: Self-pay | Admitting: Internal Medicine

## 2015-12-17 ENCOUNTER — Encounter: Payer: Self-pay | Admitting: Internal Medicine

## 2015-12-17 ENCOUNTER — Ambulatory Visit (INDEPENDENT_AMBULATORY_CARE_PROVIDER_SITE_OTHER): Payer: Self-pay | Admitting: Internal Medicine

## 2015-12-17 VITALS — BP 136/85 | HR 99 | Temp 97.6°F | Wt 289.0 lb

## 2015-12-17 DIAGNOSIS — R87610 Atypical squamous cells of undetermined significance on cytologic smear of cervix (ASC-US): Secondary | ICD-10-CM

## 2015-12-17 DIAGNOSIS — Z79899 Other long term (current) drug therapy: Secondary | ICD-10-CM

## 2015-12-17 DIAGNOSIS — R87810 Cervical high risk human papillomavirus (HPV) DNA test positive: Secondary | ICD-10-CM

## 2015-12-17 DIAGNOSIS — Z113 Encounter for screening for infections with a predominantly sexual mode of transmission: Secondary | ICD-10-CM

## 2015-12-17 DIAGNOSIS — B20 Human immunodeficiency virus [HIV] disease: Secondary | ICD-10-CM

## 2015-12-18 NOTE — Assessment & Plan Note (Signed)
Will repeat PAP here

## 2015-12-18 NOTE — Progress Notes (Signed)
  Subjective:    Patient ID: Ariel Cox, female    DOB: 1970/06/27, 45 y.o.   MRN: [id number]  HPI  Here for follow up of HIV.   She was started on Complera in June 2012 but never followed up again. When she returned > 6 months later, a genotype showed multiple resistance mutations including K70E,V90I,A98G,V108I,E138G/K/R,Y181C,M184V. She has a history of treatment of Kaletra and Combivir in the past. She was started on Combivir, boosted Prezista, Isentress. I then changed her to Triumeq and Prezcobix which she is tolerating well.  No missed doses.  CD4 430, viral load remains undetectable now over 3 years.     Review of Systems  Constitutional: Negative for fatigue.  HENT: Negative for trouble swallowing.   Gastrointestinal: Negative for diarrhea.  Skin: Negative for rash.  Neurological: Negative for light-headedness.       Objective:   Physical Exam  Constitutional: She appears well-developed and well-nourished. No distress.  HENT:  Mouth/Throat: Oropharynx is clear and moist. No oropharyngeal exudate.  Eyes: Right eye exhibits no discharge. Left eye exhibits no discharge. No scleral icterus.  Cardiovascular: Normal rate, regular rhythm and normal heart sounds.   No murmur heard. Pulmonary/Chest: Effort normal and breath sounds normal.  Lymphadenopathy:    She has no cervical adenopathy.  Vitals reviewed.   Social History   Social History  . Marital status: Single    Spouse name: N/A  . Number of children: N/A  . Years of education: N/A   Occupational History  . Not on file.   Social History Main Topics  . Smoking status: Never Smoker  . Smokeless tobacco: Never Used  . Alcohol use Yes     Comment: rare  . Drug use: No  . Sexual activity: Not Currently    Partners: Male    Birth control/ protection: None     Comment: pt. given condoms   Other Topics Concern  . Not on file   Social History Narrative  . No narrative on file        Assessment & Plan:

## 2015-12-18 NOTE — Assessment & Plan Note (Signed)
Doing well on her salvage regimen.  rtc 4 months.

## 2016-01-06 ENCOUNTER — Other Ambulatory Visit: Payer: Self-pay | Admitting: Internal Medicine

## 2016-01-06 DIAGNOSIS — B20 Human immunodeficiency virus [HIV] disease: Secondary | ICD-10-CM

## 2016-02-01 ENCOUNTER — Other Ambulatory Visit: Payer: Self-pay | Admitting: Internal Medicine

## 2016-02-01 DIAGNOSIS — B20 Human immunodeficiency virus [HIV] disease: Secondary | ICD-10-CM

## 2016-02-04 NOTE — Telephone Encounter (Signed)
Left message reminding the patient [facility] appointment for abnormal PAP smear cells.

## 2016-04-22 ENCOUNTER — Other Ambulatory Visit (INDEPENDENT_AMBULATORY_CARE_PROVIDER_SITE_OTHER): Payer: Self-pay

## 2016-04-22 DIAGNOSIS — B20 Human immunodeficiency virus [HIV] disease: Secondary | ICD-10-CM

## 2016-04-22 DIAGNOSIS — Z79899 Other long term (current) drug therapy: Secondary | ICD-10-CM

## 2016-04-22 DIAGNOSIS — Z113 Encounter for screening for infections with a predominantly sexual mode of transmission: Secondary | ICD-10-CM

## 2016-04-22 LAB — CBC WITH DIFFERENTIAL/PLATELET
Basophils Absolute: 0 cells/uL (ref 0–200)
Basophils Relative: 0 %
Eosinophils Absolute: 94 cells/uL (ref 15–500)
Eosinophils Relative: 2 %
HCT: 33.2 % — ABNORMAL LOW (ref 35.0–45.0)
Hemoglobin: 10.1 g/dL — ABNORMAL LOW (ref 11.7–15.5)
Lymphocytes Relative: 46 %
Lymphs Abs: 2162 cells/uL (ref 850–3900)
MCH: 24.4 pg — ABNORMAL LOW (ref 27.0–33.0)
MCHC: 30.4 g/dL — ABNORMAL LOW (ref 32.0–36.0)
MCV: 80.2 fL (ref 80.0–100.0)
MPV: 9.1 fL (ref 7.5–12.5)
Monocytes Absolute: 517 cells/uL (ref 200–950)
Monocytes Relative: 11 %
Neutro Abs: 1927 cells/uL (ref 1500–7800)
Neutrophils Relative %: 41 %
Platelets: 356 10*3/uL (ref 140–400)
RBC: 4.14 MIL/uL (ref 3.80–5.10)
RDW: 17.6 % — ABNORMAL HIGH (ref 11.0–15.0)
WBC: 4.7 10*3/uL (ref 3.8–10.8)

## 2016-04-22 LAB — COMPLETE METABOLIC PANEL WITH GFR
ALT: 14 U/L (ref 6–29)
AST: 19 U/L (ref 10–35)
Albumin: 3.6 g/dL (ref 3.6–5.1)
Alkaline Phosphatase: 67 U/L (ref 33–115)
BUN: 13 mg/dL (ref 7–25)
CO2: 22 mmol/L (ref 20–31)
Calcium: 8.5 mg/dL — ABNORMAL LOW (ref 8.6–10.2)
Chloride: 110 mmol/L (ref 98–110)
Creat: 1.59 mg/dL — ABNORMAL HIGH (ref 0.50–1.10)
GFR, Est African American: 45 mL/min — ABNORMAL LOW (ref >=60)
GFR, Est Non African American: 39 mL/min — ABNORMAL LOW (ref >=60)
Glucose, Bld: 82 mg/dL (ref 65–99)
Potassium: 4.6 mmol/L (ref 3.5–5.3)
Sodium: 140 mmol/L (ref 135–146)
Total Bilirubin: 0.3 mg/dL (ref 0.2–1.2)
Total Protein: 6.7 g/dL (ref 6.1–8.1)

## 2016-04-22 LAB — LIPID PANEL
Cholesterol: 187 mg/dL (ref <200)
HDL: 50 mg/dL — ABNORMAL LOW (ref >50)
LDL Cholesterol: 121 mg/dL — ABNORMAL HIGH (ref <100)
Total CHOL/HDL Ratio: 3.7 Ratio (ref <5.0)
Triglycerides: 80 mg/dL (ref <150)
VLDL: 16 mg/dL (ref <30)

## 2016-04-23 LAB — T-HELPER CELL (CD4) - (RCID CLINIC ONLY)
CD4 % Helper T Cell: 18 % — ABNORMAL LOW (ref 33–55)
CD4 T Cell Abs: 430 /uL (ref 400–2700)

## 2016-04-23 LAB — RPR

## 2016-04-24 LAB — HIV-1 RNA QUANT-NO REFLEX-BLD
HIV 1 RNA Quant: 36 copies/mL — ABNORMAL HIGH
HIV-1 RNA Quant, Log: 1.56 Log copies/mL — ABNORMAL HIGH

## 2016-05-06 ENCOUNTER — Ambulatory Visit (INDEPENDENT_AMBULATORY_CARE_PROVIDER_SITE_OTHER): Payer: Self-pay | Admitting: Internal Medicine

## 2016-05-06 ENCOUNTER — Encounter: Payer: Self-pay | Admitting: Internal Medicine

## 2016-05-06 VITALS — BP 142/87 | HR 84 | Temp 98.3°F | Ht 66.5 in | Wt 283.0 lb

## 2016-05-06 DIAGNOSIS — R87810 Cervical high risk human papillomavirus (HPV) DNA test positive: Secondary | ICD-10-CM

## 2016-05-06 DIAGNOSIS — B20 Human immunodeficiency virus [HIV] disease: Secondary | ICD-10-CM

## 2016-05-06 DIAGNOSIS — Z113 Encounter for screening for infections with a predominantly sexual mode of transmission: Secondary | ICD-10-CM

## 2016-05-06 DIAGNOSIS — R87610 Atypical squamous cells of undetermined significance on cytologic smear of cervix (ASC-US): Secondary | ICD-10-CM

## 2016-05-06 DIAGNOSIS — Z79899 Other long term (current) drug therapy: Secondary | ICD-10-CM

## 2016-05-06 MED ORDER — MENINGOCOCCAL A C Y&W-135 OLIG IM SOLR
0.5000 mL | Freq: Once | INTRAMUSCULAR | Status: AC
  Administered 2016-05-06: 0.5 mL via INTRAMUSCULAR

## 2016-05-07 NOTE — Assessment & Plan Note (Signed)
Screened negative 

## 2016-05-07 NOTE — Assessment & Plan Note (Addendum)
Doing well on her current regimen and will continue rtc 6 months

## 2016-05-07 NOTE — Assessment & Plan Note (Signed)
I have recommended to get her repeat PAP soon as per the recommendations

## 2016-05-07 NOTE — Progress Notes (Signed)
  Subjective:    Patient ID: Ariel Cox, female    DOB: [birth year], 46 y.o.   MRN: [id number]  HPI  Here for follow up of HIV.   Has multiple resistance mutations including K70E,V90I,A98G,V108I,E138G/K/R,Y181C,M184V. She continues on Triumeq and Prezcobix which she is tolerating well.  No missed doses.  CD4 430, viral load remains suppressed at just 36 copies.  No associated n/v/d. Has been suppressed over 3 years. Has not had a recent PAP smear.      Review of Systems  Constitutional: Negative for fatigue.  Gastrointestinal: Negative for diarrhea.  Skin: Negative for rash.  Neurological: Negative for light-headedness.       Objective:   Physical Exam  Constitutional: She appears well-developed and well-nourished. No distress.  HENT:  Mouth/Throat: Oropharynx is clear and moist. No oropharyngeal exudate.  Cardiovascular: Normal rate, regular rhythm and normal heart sounds.   No murmur heard. Pulmonary/Chest: Effort normal and breath sounds normal.  Lymphadenopathy:    She has no cervical adenopathy.  Skin: No rash noted.  Vitals reviewed.   SH: no tobacco      Assessment & Plan:

## 2016-05-07 NOTE — Assessment & Plan Note (Signed)
Her creat is elevated a bit.  Discussed with her to limit NSAIDS. Will recheck next visit.

## 2016-05-22 ENCOUNTER — Telehealth (HOSPITAL_COMMUNITY): Payer: Self-pay | Admitting: *Deleted

## 2016-05-22 NOTE — Telephone Encounter (Signed)
Telephoned patient at home number and left message to return call to [facility] 

## 2016-06-09 ENCOUNTER — Other Ambulatory Visit: Payer: Self-pay | Admitting: Obstetrics and Gynecology

## 2016-06-09 DIAGNOSIS — Z1231 Encounter for screening mammogram for malignant neoplasm of breast: Secondary | ICD-10-CM

## 2016-06-19 ENCOUNTER — Encounter (HOSPITAL_COMMUNITY): Payer: Self-pay

## 2016-06-19 ENCOUNTER — Ambulatory Visit (HOSPITAL_COMMUNITY)
Admission: RE | Admit: 2016-06-19 | Discharge: 2016-06-19 | Disposition: A | Discharge status: Home / Self Care | Payer: Self-pay | Source: Ambulatory Visit | Attending: Obstetrics and Gynecology | Admitting: Obstetrics and Gynecology

## 2016-06-19 ENCOUNTER — Ambulatory Visit
Admission: RE | Admit: 2016-06-19 | Discharge: 2016-06-19 | Disposition: A | Discharge status: Home / Self Care | Payer: No Typology Code available for payment source | Source: Ambulatory Visit | Attending: Obstetrics and Gynecology | Admitting: Obstetrics and Gynecology

## 2016-06-19 VITALS — BP 124/80 | Temp 98.6°F | Ht 67.0 in | Wt 281.0 lb

## 2016-06-19 DIAGNOSIS — Z1231 Encounter for screening mammogram for malignant neoplasm of breast: Secondary | ICD-10-CM

## 2016-06-19 DIAGNOSIS — Z01419 Encounter for gynecological examination (general) (routine) without abnormal findings: Secondary | ICD-10-CM

## 2016-06-19 NOTE — Patient Instructions (Signed)
Explained breast self awareness with Ariel FillersYvette R Cox. Let patient know that follow-up for today's Pap smear will be based on the result. [facility]. Appointment scheduled for Thursday, Jun 19, 2016 at 0910. Let patient know will follow up with her within the next couple weeks with results of Pap smear by phone. Informed patient that the Breast Center will follow up with her within the next couple of weeks with results of mammogram by letter or phone. Ariel Cox verbalized understanding.  Ansel Ferrall, Kathaleen Maserhristine Poll, RN 8:38 AM

## 2016-06-19 NOTE — Addendum Note (Signed)
Encounter addended by: Lynnell DikeSabrina H Ramari Bray, LPN on: 1/6/10965/04/2016  9:15 AM<BR>    Actions taken: Order list changed

## 2016-06-19 NOTE — Progress Notes (Signed)
No complaints today.   Pap Smear: Pap smear completed today. Last Pap smear was [facility]/21/2014 [facility] and ASCUS HPV+. 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 Per patient has a history of two other abnormal Pap smears. 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 Per patient has a history of an abnormal Pap smear 10 years ago that required a colposcopy for follow-up. Last three Pap smear results and last colposcopy and LEEP results are in EPIC.  Physical exam: Breasts Breasts symmetrical. No skin abnormalities bilateral breasts. No nipple retraction bilateral breasts. No nipple discharge bilateral breasts. No lymphadenopathy. No lumps palpated bilateral breasts. No complaints of pain or tenderness on exam. [facility] mamm[facility]. Appointment scheduled for Thursday, Jun 19, 2016 at 0910.  Pelvic/Bimanual   Ext Genitalia No lesions, no swelling and no discharge observed on external genitalia.         Vagina Vagina pink and normal texture. No lesions or discharge observed in vagina.          Cervix Cervix is present. Cervix pink and of normal texture. No discharge observed.     Uterus Uterus is present and palpable. Uterus in normal position and normal size.        Adnexae Bilateral ovaries present and palpable. No tenderness on palpation.          Rectovaginal No rectal exam completed today since patient had no rectal complaints. No skin abnormalities observed on exam.    Smoking History: Patient has never smoked.  Patient Navigation: Patient education provided. [facility] to services [facility].

## 2016-06-20 ENCOUNTER — Encounter (HOSPITAL_COMMUNITY): Payer: Self-pay | Admitting: *Deleted

## 2016-06-23 LAB — CYTOLOGY - PAP
Adequacy: ABSENT
Diagnosis: NEGATIVE
HPV: NOT DETECTED

## 2016-06-25 ENCOUNTER — Other Ambulatory Visit (HOSPITAL_COMMUNITY): Payer: Self-pay | Admitting: *Deleted

## 2016-06-25 DIAGNOSIS — A599 Trichomoniasis, unspecified: Secondary | ICD-10-CM

## 2016-06-25 MED ORDER — METRONIDAZOLE 500 MG PO TABS: 500.0000 mg | ORAL_TABLET | Freq: Two times a day (BID) | ORAL | 0 refills | Status: DC

## 2016-06-26 ENCOUNTER — Telehealth (HOSPITAL_COMMUNITY): Payer: Self-pay | Admitting: *Deleted

## 2016-06-26 NOTE — Telephone Encounter (Signed)
Telephoned patient at home number and left message to return call to [facility] 

## 2016-06-27 ENCOUNTER — Telehealth (HOSPITAL_COMMUNITY): Payer: Self-pay | Admitting: *Deleted

## 2016-06-27 NOTE — Telephone Encounter (Signed)
Telephoned patient at home number and advised patient of negative pap smear results. HPV was negative. Next pap smear due in one year. Advised patient pap smear did show  Trichomoniasis and medication was called into pharmacy. Advised patient no alcohol while taking medication, no sexual contact until finish all medication, and make sure to finish medication. Paitne voiced understanding.

## 2016-07-16 ENCOUNTER — Other Ambulatory Visit: Payer: Self-pay | Admitting: Internal Medicine

## 2016-07-16 DIAGNOSIS — B20 Human immunodeficiency virus [HIV] disease: Secondary | ICD-10-CM

## 2017-02-03 ENCOUNTER — Other Ambulatory Visit: Payer: Self-pay | Admitting: Internal Medicine

## 2017-02-03 DIAGNOSIS — B20 Human immunodeficiency virus [HIV] disease: Secondary | ICD-10-CM

## 2018-09-03 ENCOUNTER — Telehealth: Payer: Self-pay

## 2018-09-03 ENCOUNTER — Telehealth: Payer: Self-pay | Admitting: Obstetrics & Gynecology

## 2018-09-03 NOTE — Telephone Encounter (Signed)
Pt called stating that she had a LEEP procedure a couple years ago.  Now that she has had an MRI and it showed a cyst could someone give her a call back.

## 2018-09-09 NOTE — Telephone Encounter (Signed)
Opened in error

## 2018-09-29 ENCOUNTER — Ambulatory Visit (INDEPENDENT_AMBULATORY_CARE_PROVIDER_SITE_OTHER): Payer: 59 | Admitting: Obstetrics and Gynecology

## 2018-09-29 ENCOUNTER — Other Ambulatory Visit: Payer: Self-pay

## 2018-09-29 ENCOUNTER — Encounter: Payer: Self-pay | Admitting: Obstetrics and Gynecology

## 2018-09-29 VITALS — BP 149/86 | HR 73 | Wt 265.1 lb

## 2018-09-29 DIAGNOSIS — R1900 Intra-abdominal and pelvic swelling, mass and lump, unspecified site: Secondary | ICD-10-CM | POA: Diagnosis not present

## 2018-09-29 DIAGNOSIS — Z124 Encounter for screening for malignant neoplasm of cervix: Secondary | ICD-10-CM | POA: Diagnosis not present

## 2018-09-29 DIAGNOSIS — Z1239 Encounter for other screening for malignant neoplasm of breast: Secondary | ICD-10-CM

## 2018-09-29 NOTE — Progress Notes (Signed)
[place] scheduled for August 25th @ 0800.  [facility] will contact the pt with appt.  Pt notified.

## 2018-09-29 NOTE — Progress Notes (Signed)
48 yo P1 presenting today for the evaluation of pelvic mass seen on MRI scan during ortho evaluation for hip pain. Patient is doing well without complaints. She reports a monthly period lasting 3-4 days. Patient also reports onset of fecal incontinence over the past year which seems to have worsened recently. Patient is due for screening mammogram and pap smear  Past Medical History:  Diagnosis Date  . Depression   . HIV infection ([place])   . Hypertension    Past Surgical History:  Procedure Laterality Date  . GASTRIC BYPASS  2009   Family History  Problem Relation Age of Onset  . Breast cancer Maternal Aunt   . Hypertension Father   . Diabetes Maternal Grandmother   . Cancer Paternal Grandmother        Pancreatic  . Cancer Maternal Grandfather        lung   Social History   Tobacco Use  . Smoking status: Never Smoker  . Smokeless tobacco: Never Used  Substance Use Topics  . Alcohol use: Yes    Comment: rare  . Drug use: No   ROS See pertinent in HPI  Blood pressure (!) 149/86, pulse 73, weight 265 lb 1.6 oz (120.2 kg), last menstrual period 09/17/2018.  GENERAL: Well-developed, well-nourished female in no acute distress.  ABDOMEN: Soft, nontender, nondistended. No organomegaly. PELVIC: Normal external female genitalia. Vagina is pink and rugated.  Normal discharge. Normal appearing cervix. Uterus is normal in size. No adnexal mass or tenderness. No evidence of posterior wall prolapse.  EXTREMITIES: No cyanosis, clubbing, or edema, 2+ distal pulses.  A/P 48 yo P1 with incidental finding of a pelvic mass on MRI - pelvic ultrasound ordered - pap smear and screening mammogram ordered - Patient will be contacted with abnormal results - Patient advised to follow up with PCP regarding fecal incontinence and possible referral to colorectal surgeon

## 2018-10-01 LAB — CYTOLOGY - PAP: Diagnosis: NEGATIVE

## 2018-10-11 NOTE — Telephone Encounter (Signed)
Pt concerns addressed at her appt on 09/29/18.

## 2018-10-12 ENCOUNTER — Other Ambulatory Visit: Payer: Self-pay

## 2018-10-12 ENCOUNTER — Ambulatory Visit (HOSPITAL_COMMUNITY)
Admission: RE | Admit: 2018-10-12 | Discharge: 2018-10-12 | Disposition: A | Discharge status: Home / Self Care | Payer: 59 | Source: Ambulatory Visit | Attending: Obstetrics and Gynecology | Admitting: Obstetrics and Gynecology

## 2018-10-12 DIAGNOSIS — R1900 Intra-abdominal and pelvic swelling, mass and lump, unspecified site: Secondary | ICD-10-CM | POA: Diagnosis present

## 2018-10-19 ENCOUNTER — Telehealth: Payer: Self-pay | Admitting: General Practice

## 2018-10-19 NOTE — Telephone Encounter (Signed)
Patient called and left message on nurse voicemail line requesting ultrasound results. Called patient & informed her of results. Patient verbalized understanding & had no questions.

## 2018-11-17 ENCOUNTER — Ambulatory Visit
Admission: RE | Admit: 2018-11-17 | Discharge: 2018-11-17 | Disposition: A | Discharge status: Home / Self Care | Payer: 59 | Source: Ambulatory Visit | Attending: Obstetrics and Gynecology | Admitting: Obstetrics and Gynecology

## 2018-11-17 ENCOUNTER — Other Ambulatory Visit: Payer: Self-pay

## 2018-11-17 DIAGNOSIS — Z1239 Encounter for other screening for malignant neoplasm of breast: Secondary | ICD-10-CM

## 2019-06-03 ENCOUNTER — Other Ambulatory Visit: Payer: Self-pay

## 2019-06-03 ENCOUNTER — Emergency Department (HOSPITAL_COMMUNITY)
Admission: EM | Admit: 2019-06-03 | Discharge: 2019-06-03 | Disposition: A | Discharge status: Home / Self Care | Payer: 59 | Attending: Emergency Medicine | Admitting: Emergency Medicine

## 2019-06-03 ENCOUNTER — Encounter (HOSPITAL_COMMUNITY): Payer: Self-pay

## 2019-06-03 DIAGNOSIS — R1310 Dysphagia, unspecified: Secondary | ICD-10-CM | POA: Diagnosis not present

## 2019-06-03 DIAGNOSIS — K219 Gastro-esophageal reflux disease without esophagitis: Secondary | ICD-10-CM | POA: Diagnosis not present

## 2019-06-03 DIAGNOSIS — Z79899 Other long term (current) drug therapy: Secondary | ICD-10-CM | POA: Diagnosis not present

## 2019-06-03 DIAGNOSIS — B20 Human immunodeficiency virus [HIV] disease: Secondary | ICD-10-CM | POA: Insufficient documentation

## 2019-06-03 DIAGNOSIS — K222 Esophageal obstruction: Secondary | ICD-10-CM | POA: Diagnosis not present

## 2019-06-03 HISTORY — DX: Esophageal obstruction: K22.2

## 2019-06-03 HISTORY — DX: Gastro-esophageal reflux disease without esophagitis: K21.9

## 2019-06-03 LAB — BASIC METABOLIC PANEL
Anion gap: 8 (ref 5–15)
BUN: 9 mg/dL (ref 6–20)
CO2: 21 mmol/L — ABNORMAL LOW (ref 22–32)
Calcium: 8 mg/dL — ABNORMAL LOW (ref 8.9–10.3)
Chloride: 111 mmol/L (ref 98–111)
Creatinine, Ser: 0.89 mg/dL (ref 0.44–1.00)
GFR calc Af Amer: >60 mL/min (ref >60)
GFR calc non Af Amer: >60 mL/min (ref >60)
Glucose, Bld: 92 mg/dL (ref 70–99)
Potassium: 3.5 mmol/L (ref 3.5–5.1)
Sodium: 140 mmol/L (ref 135–145)

## 2019-06-03 LAB — CBC WITH DIFFERENTIAL/PLATELET
Abs Immature Granulocytes: 0.01 10*3/uL (ref 0.00–0.07)
Basophils Absolute: 0 10*3/uL (ref 0.0–0.1)
Basophils Relative: 1 %
Eosinophils Absolute: 0.3 10*3/uL (ref 0.0–0.5)
Eosinophils Relative: 7 %
HCT: 32.2 % — ABNORMAL LOW (ref 36.0–46.0)
Hemoglobin: 10.1 g/dL — ABNORMAL LOW (ref 12.0–15.0)
Immature Granulocytes: 0 %
Lymphocytes Relative: 49 %
Lymphs Abs: 2.3 10*3/uL (ref 0.7–4.0)
MCH: 23.2 pg — ABNORMAL LOW (ref 26.0–34.0)
MCHC: 31.4 g/dL (ref 30.0–36.0)
MCV: 74 fL — ABNORMAL LOW (ref 80.0–100.0)
Monocytes Absolute: 0.8 10*3/uL (ref 0.1–1.0)
Monocytes Relative: 16 %
Neutro Abs: 1.3 10*3/uL — ABNORMAL LOW (ref 1.7–7.7)
Neutrophils Relative %: 27 %
Platelets: 288 10*3/uL (ref 150–400)
RBC: 4.35 MIL/uL (ref 3.87–5.11)
RDW: 18.6 % — ABNORMAL HIGH (ref 11.5–15.5)
WBC: 4.7 10*3/uL (ref 4.0–10.5)
nRBC: 0 % (ref 0.0–0.2)

## 2019-06-03 MED ORDER — PANTOPRAZOLE SODIUM 20 MG PO TBEC: 20.0000 mg | DELAYED_RELEASE_TABLET | Freq: Two times a day (BID) | ORAL | 0 refills | Status: DC

## 2019-06-03 NOTE — ED Provider Notes (Signed)
[facility] DEPT Provider Note   CSN: [id number] Arrival date & time: 06/03/19  0947     History Chief Complaint  Patient presents with  . Dysphagia    Ariel Cox is a 49 y.o. female.  The history is provided by the patient.  Illness Severity:  Mild Onset quality:  Gradual Timing:  Constant Progression:  Unchanged Chronicity:  Recurrent Context:  Patient here for difficulty swallowing.  History of esophageal strictures.  Used to follow with GI a long time ago but do not currently have a GI doctor.  States that over the last 4 weeks has had issues with swallowing.  Difficulty with solid foods.  Now starting to have some difficulty with liquids but is able to hold down liquids. Relieved by:  Nothing Worsened by:  Eating Ineffective treatments:  Prevacid, Nexium Associated symptoms: no abdominal pain, no chest pain, no congestion, no cough, no diarrhea, no ear pain, no fatigue, no fever, no headaches, no loss of consciousness, no myalgias, no nausea, no rash, no rhinorrhea, no shortness of breath, no sore throat, no vomiting and no wheezing        Past Medical History:  Diagnosis Date  . Depression   . Esophageal stricture   . GERD (gastroesophageal reflux disease)   . HIV infection ([place])   . Hypertension     Patient Active Problem List   Diagnosis Date Noted  . Screening examination for venereal disease 08/24/2014  . Encounter for long-term (current) use of medications 08/24/2014  . Atypical squamous cell changes of undetermined significance (ASCUS) on cervical cytology with positive high risk human papilloma virus (HPV) 05/24/2013  . ESOPHAGEAL STRICTURE 06/29/2009  . Obesity 06/09/2008  . HYPERLIPIDEMIA 01/04/2008  . Depression 01/19/2007  . Essential hypertension, benign 01/19/2007  . HYPERGLYCEMIA 01/19/2007  . Human immunodeficiency virus (HIV) disease ([place]) 11/19/2005    Past Surgical History:  Procedure Laterality  Date  . GASTRIC BYPASS  2009     OB History    Gravida  1   Para  1   Term  1   Preterm      AB      Living  1     SAB      TAB      Ectopic      Multiple      Live Births              Family History  Problem Relation Age of Onset  . Breast cancer Maternal Aunt   . Hypertension Father   . Diabetes Maternal Grandmother   . Cancer Paternal Grandmother        Pancreatic  . Cancer Maternal Grandfather        lung    Social History   Tobacco Use  . Smoking status: Never Smoker  . Smokeless tobacco: Never Used  Substance Use Topics  . Alcohol use: Yes    Comment: rare  . Drug use: No    Home Medications Prior to Admission medications   Medication Sig Start Date End Date Taking? Authorizing Provider  amLODipine (NORVASC) 5 MG tablet TAKE 1 TABLET BY MOUTH EVERY DAY Patient not taking: Reported on 05/06/2016 10/30/14   Thayer Headings, MD  omeprazole (PRILOSEC) 20 MG capsule Take 20 mg by mouth daily. 06/03/19   [provider]  pantoprazole (PROTONIX) 20 MG tablet Take 1 tablet (20 mg total) by mouth 2 (two) times daily. 06/03/19 07/03/19  Lennice Sites, DO  PREZCOBIX 800-150 MG tablet TAKE 1 TABLET BY MOUTH DAILY WITH FOOD. SWALLOW WHOLE. DO NOT CRUSH, BREAK OR CHEW TABLETS Patient not taking: Reported on 09/29/2018 02/03/17   Gardiner Barefoot, MD  traZODone (DESYREL) 100 MG tablet TAKE 1/2 TO 1 TABLET BY MOUTH EVERY NIGHT AT BEDTIME FOR SLEEP. STOP ELAVIL Patient not taking: Reported on 05/06/2016 07/03/14   Gardiner Barefoot, MD  TRIUMEQ 600-50-300 MG tablet TAKE 1 TABLET BY MOUTH EVERY DAY Patient not taking: Reported on 09/29/2018 02/03/17   Gardiner Barefoot, MD    Allergies    Shellfish allergy  Review of Systems   Review of Systems  Constitutional: Negative for chills, fatigue and fever.  HENT: Positive for trouble swallowing. Negative for congestion, dental problem, drooling, ear discharge, ear pain, facial swelling, hearing loss, mouth sores,  nosebleeds, postnasal drip, rhinorrhea, sore throat and voice change.   Eyes: Negative for pain and visual disturbance.  Respiratory: Negative for cough, shortness of breath and wheezing.   Cardiovascular: Negative for chest pain and palpitations.  Gastrointestinal: Negative for abdominal pain, diarrhea, nausea and vomiting.  Genitourinary: Negative for dysuria and hematuria.  Musculoskeletal: Negative for arthralgias, back pain and myalgias.  Skin: Negative for color change and rash.  Neurological: Negative for seizures, loss of consciousness, syncope and headaches.  All other systems reviewed and are negative.   Physical Exam Updated Vital Signs  ED Triage Vitals  Enc Vitals Group     BP 06/03/19 0956 (!) 177/112     Pulse Rate 06/03/19 0956 77     Resp 06/03/19 0956 16     Temp 06/03/19 0956 98.1 F (36.7 C)     Temp Source 06/03/19 0956 Oral     SpO2 06/03/19 0956 100 %     Weight 06/03/19 0959 261 lb (118.4 kg)     Height 06/03/19 0959 5\' 7"  (1.702 m)     Head Circumference --      Peak Flow --      Pain Score 06/03/19 0958 7     Pain Loc --      Pain Edu? --      Excl. in GC? --     Physical Exam Vitals and nursing note reviewed.  Constitutional:      General: She is not in acute distress.    Appearance: She is well-developed. She is not ill-appearing.  HENT:     Head: Normocephalic and atraumatic.     Nose: Nose normal.     Mouth/Throat:     Mouth: Mucous membranes are moist.     Pharynx: No oropharyngeal exudate or posterior oropharyngeal erythema.  Eyes:     Extraocular Movements: Extraocular movements intact.     Conjunctiva/sclera: Conjunctivae normal.     Pupils: Pupils are equal, round, and reactive to light.  Cardiovascular:     Rate and Rhythm: Normal rate and regular rhythm.     Pulses: Normal pulses.     Heart sounds: No murmur.  Pulmonary:     Effort: Pulmonary effort is normal. No respiratory distress.     Breath sounds: Normal breath sounds.    Abdominal:     Palpations: Abdomen is soft.     Tenderness: There is no abdominal tenderness.  Musculoskeletal:     Cervical back: Neck supple.  Skin:    General: Skin is warm and dry.  Neurological:     Mental Status: She is alert.     ED Results / Procedures / Treatments  Labs (all labs ordered are listed, but only abnormal results are displayed) Labs Reviewed  CBC WITH DIFFERENTIAL/PLATELET - Abnormal; Notable for the following components:      Result Value   Hemoglobin 10.1 (*)    HCT 32.2 (*)    MCV 74.0 (*)    MCH 23.2 (*)    RDW 18.6 (*)    Neutro Abs 1.3 (*)    All other components within normal limits  BASIC METABOLIC PANEL - Abnormal; Notable for the following components:   CO2 21 (*)    Calcium 8.0 (*)    All other components within normal limits    EKG None  Radiology No results found.  Procedures Procedures (including critical care time)  Medications Ordered in ED Medications - No data to display  ED Course  I have reviewed the triage vital signs and the nursing notes.  Pertinent labs & imaging results that were available during my care of the patient were reviewed by me and considered in my medical decision making (see chart for details).    MDM Rules/Calculators/A&P                      Yarethzy Croak is a 49 year old female with history of esophageal strictures who presents to the ED with difficulty swallowing.  Patient with unremarkable vitals.  No fever.  Difficulty swallowing over the last 4 weeks.  Both with solids and liquids but has been able to take liquids down.  Lab work shows no significant anemia, electrolyte abnormality, kidney injury.  No signs of thrush on exam.  Overall oropharynx is clear.  No obvious distress.  H&P not consistent with acute food impaction.  Does not have a GI physician and we will have her follow-up closely with GI for EGD.  Recommend continued soft diet with pured foods and liquids.  Will prescribe  Protonix.  Given return precautions and discharged from the ED in good condition.  This chart was dictated using voice recognition software.  Despite best efforts to proofread,  errors can occur which can change the documentation meaning.    Final Clinical Impression(s) / ED Diagnoses Final diagnoses:  Dysphagia, unspecified type    Rx / DC Orders ED Discharge Orders         Ordered    pantoprazole (PROTONIX) 20 MG tablet  2 times daily     06/03/19 1216           [place], DO 06/03/19 1216

## 2019-06-03 NOTE — ED Triage Notes (Signed)
Patient reports that she has a history of esophageal strictures. Patient states she recently began having difficulty swallowing x 1 month, but has gotten worse recently. Patient is able to swallow her own saliva, but states that she does have difficulty swallowing liquids.

## 2019-06-03 NOTE — ED Notes (Signed)
[facility], states history of esophageal stricture-difficulty swallowing for over a week-has not followed up with GI in 14 years

## 2019-06-07 ENCOUNTER — Other Ambulatory Visit: Payer: Self-pay | Admitting: Physician Assistant

## 2019-06-07 DIAGNOSIS — R1310 Dysphagia, unspecified: Secondary | ICD-10-CM

## 2019-06-16 ENCOUNTER — Ambulatory Visit
Admission: RE | Admit: 2019-06-16 | Discharge: 2019-06-16 | Disposition: A | Discharge status: Home / Self Care | Payer: 59 | Source: Ambulatory Visit | Attending: Physician Assistant | Admitting: Physician Assistant

## 2019-06-16 ENCOUNTER — Other Ambulatory Visit: Payer: Self-pay | Admitting: Physician Assistant

## 2019-06-16 DIAGNOSIS — R1310 Dysphagia, unspecified: Secondary | ICD-10-CM

## 2019-06-23 ENCOUNTER — Other Ambulatory Visit: Payer: Self-pay

## 2019-06-23 ENCOUNTER — Other Ambulatory Visit: Payer: 59

## 2019-06-23 ENCOUNTER — Other Ambulatory Visit (HOSPITAL_COMMUNITY)
Admission: RE | Admit: 2019-06-23 | Discharge: 2019-06-23 | Disposition: A | Discharge status: Home / Self Care | Payer: 59 | Source: Ambulatory Visit | Attending: Internal Medicine | Admitting: Internal Medicine

## 2019-06-23 DIAGNOSIS — B20 Human immunodeficiency virus [HIV] disease: Secondary | ICD-10-CM | POA: Diagnosis not present

## 2019-06-24 LAB — T-HELPER CELL (CD4) - (RCID CLINIC ONLY)
CD4 % Helper T Cell: 5 % — ABNORMAL LOW (ref 33–65)
CD4 T Cell Abs: 112 /uL — ABNORMAL LOW (ref 400–1790)

## 2019-06-25 LAB — LIPID PANEL
Cholesterol: 168 mg/dL (ref <200)
HDL: 39 mg/dL — ABNORMAL LOW (ref >=50)
LDL Cholesterol (Calc): 111 mg/dL (calc) — ABNORMAL HIGH
Non-HDL Cholesterol (Calc): 129 mg/dL (calc) (ref <130)
Total CHOL/HDL Ratio: 4.3 (calc) (ref <5.0)
Triglycerides: 86 mg/dL (ref <150)

## 2019-06-25 LAB — CBC WITH DIFFERENTIAL/PLATELET
Absolute Monocytes: 704 cells/uL (ref 200–950)
Basophils Absolute: 22 cells/uL (ref 0–200)
Basophils Relative: 0.5 %
Eosinophils Absolute: 229 cells/uL (ref 15–500)
Eosinophils Relative: 5.2 %
HCT: 30.7 % — ABNORMAL LOW (ref 35.0–45.0)
Hemoglobin: 9.4 g/dL — ABNORMAL LOW (ref 11.7–15.5)
Lymphs Abs: 2438 cells/uL (ref 850–3900)
MCH: 23.3 pg — ABNORMAL LOW (ref 27.0–33.0)
MCHC: 30.6 g/dL — ABNORMAL LOW (ref 32.0–36.0)
MCV: 76 fL — ABNORMAL LOW (ref 80.0–100.0)
MPV: 9.8 fL (ref 7.5–12.5)
Monocytes Relative: 16 %
Neutro Abs: 1008 cells/uL — ABNORMAL LOW (ref 1500–7800)
Neutrophils Relative %: 22.9 %
Platelets: 255 10*3/uL (ref 140–400)
RBC: 4.04 10*6/uL (ref 3.80–5.10)
RDW: 17.7 % — ABNORMAL HIGH (ref 11.0–15.0)
Total Lymphocyte: 55.4 %
WBC: 4.4 10*3/uL (ref 3.8–10.8)

## 2019-06-25 LAB — COMPLETE METABOLIC PANEL WITH GFR
AG Ratio: 0.9 (calc) — ABNORMAL LOW (ref 1.0–2.5)
ALT: 14 U/L (ref 6–29)
AST: 22 U/L (ref 10–35)
Albumin: 3.8 g/dL (ref 3.6–5.1)
Alkaline phosphatase (APISO): 59 U/L (ref 31–125)
BUN/Creatinine Ratio: 10 (calc) (ref 6–22)
BUN: 12 mg/dL (ref 7–25)
CO2: 21 mmol/L (ref 20–32)
Calcium: 7.9 mg/dL — ABNORMAL LOW (ref 8.6–10.2)
Chloride: 110 mmol/L (ref 98–110)
Creat: 1.19 mg/dL — ABNORMAL HIGH (ref 0.50–1.10)
GFR, Est African American: 62 mL/min/{1.73_m2} (ref >=60)
GFR, Est Non African American: 54 mL/min/{1.73_m2} — ABNORMAL LOW (ref >=60)
Globulin: 4.1 g/dL (calc) — ABNORMAL HIGH (ref 1.9–3.7)
Glucose, Bld: 66 mg/dL (ref 65–99)
Potassium: 4 mmol/L (ref 3.5–5.3)
Sodium: 137 mmol/L (ref 135–146)
Total Bilirubin: 0.2 mg/dL (ref 0.2–1.2)
Total Protein: 7.9 g/dL (ref 6.1–8.1)

## 2019-06-25 LAB — HIV-1 RNA QUANT-NO REFLEX-BLD
HIV 1 RNA Quant: 27100 copies/mL — ABNORMAL HIGH
HIV-1 RNA Quant, Log: 4.43 Log copies/mL — ABNORMAL HIGH

## 2019-06-25 LAB — RPR: RPR Ser Ql: NONREACTIVE

## 2019-06-27 LAB — URINE CYTOLOGY ANCILLARY ONLY
Chlamydia: NEGATIVE
Comment: NEGATIVE
Comment: NORMAL
Neisseria Gonorrhea: NEGATIVE

## 2019-07-11 ENCOUNTER — Telehealth: Payer: Self-pay | Admitting: Pharmacy Technician

## 2019-07-11 ENCOUNTER — Other Ambulatory Visit: Payer: Self-pay | Admitting: Pharmacist

## 2019-07-11 ENCOUNTER — Encounter: Payer: Self-pay | Admitting: Internal Medicine

## 2019-07-11 ENCOUNTER — Other Ambulatory Visit: Payer: Self-pay

## 2019-07-11 ENCOUNTER — Ambulatory Visit (INDEPENDENT_AMBULATORY_CARE_PROVIDER_SITE_OTHER): Payer: 59 | Admitting: Internal Medicine

## 2019-07-11 VITALS — BP 134/88 | HR 76 | Temp 98.4°F | Ht 67.0 in | Wt 275.0 lb

## 2019-07-11 DIAGNOSIS — R87610 Atypical squamous cells of undetermined significance on cytologic smear of cervix (ASC-US): Secondary | ICD-10-CM

## 2019-07-11 DIAGNOSIS — B20 Human immunodeficiency virus [HIV] disease: Secondary | ICD-10-CM | POA: Diagnosis not present

## 2019-07-11 DIAGNOSIS — R87810 Cervical high risk human papillomavirus (HPV) DNA test positive: Secondary | ICD-10-CM | POA: Diagnosis not present

## 2019-07-11 MED ORDER — BICTEGRAVIR-EMTRICITAB-TENOFOV 50-200-25 MG PO TABS: 1.0000 | ORAL_TABLET | Freq: Every day | ORAL | 5 refills | Status: DC

## 2019-07-11 MED ORDER — DARUNAVIR-COBICISTAT 800-150 MG PO TABS: 1.0000 | ORAL_TABLET | Freq: Every day | ORAL | 5 refills | Status: DC

## 2019-07-11 MED ORDER — SULFAMETHOXAZOLE-TRIMETHOPRIM 800-160 MG PO TABS: 1.0000 | ORAL_TABLET | Freq: Every day | ORAL | 5 refills | Status: DC

## 2019-07-11 NOTE — Progress Notes (Signed)
Sending Biktarvy and Prezcobix to [facility] [place] per [employer].

## 2019-07-11 NOTE — Progress Notes (Signed)
Patient ID: Ariel Cox, female    DOB: September 17, 1970, 49 y.o.   MRN: [id number]  Reason for visit: to re-establish care for HIV  HPI:   I last saw her over 3 years ago for HIV and was on a salvage regimen with Triumeq and Prezcobix and was doing well.  She though was lost to follow up and predominately with cost concerns after getting insurance.  She did not ever discuss these issues with [place].  She represented to her PCP with issues of dysphagia and noted thrush, likely esophageal.  She was given fluconazole and now improved.   She did get labs prior to this visit and CD 4 just 112 and viral load up at 27,100.  Hgb 9.4, creat 1.19.  She is very interested in getting back on treatment.  No weight loss, no diarrhea.   Past Medical History:  Diagnosis Date  . Depression   . Esophageal stricture   . GERD (gastroesophageal reflux disease)   . HIV infection ([facility])   . Hypertension     Prior to Admission medications   Medication Sig Start Date End Date Taking? Authorizing Provider  omeprazole (PRILOSEC) 20 MG capsule Take 20 mg by mouth daily. 06/03/19   [provider]    Allergies  Allergen Reactions  . Shellfish Allergy Rash    Social History   Tobacco Use  . Smoking status: Never Smoker  . Smokeless tobacco: Never Used  Substance Use Topics  . Alcohol use: Yes    Comment: rare  . Drug use: No    Family History  Problem Relation Age of Onset  . Breast cancer Maternal Aunt   . Hypertension Father   . Diabetes Maternal Grandmother   . Cancer Paternal Grandmother        Pancreatic  . Cancer Maternal Grandfather        lung    Review of Systems Constitutional: negative for fatigue, malaise and anorexia Respiratory: negative for cough or sputum Gastrointestinal: negative for nausea and diarrhea Genitourinary: negative for dysuria Integument/breast: negative for rash Hematologic/lymphatic: negative for lymphadenopathy Musculoskeletal: negative for myalgias and  arthralgias All other systems reviewed and are negative    CONSTITUTIONAL:in no apparent distress  Vitals:   07/11/19 0915  BP: 134/88  Pulse: 76  Temp: 98.4 F (36.9 C)  SpO2: 100%   EYES: anicteric HENT: no thrush CARD:Cor RRR RESP:clear BJ:SEGBT sounds are normal, liver is not enlarged, spleen is not enlarged MS:no pedal edema noted SKIN: no rash NEURO: non-focal  Lab Results  Component Value Date   HIV1RNAQUANT 27,100 (H) 06/23/2019   HIV1RNAQUANT 36 (H) 04/22/2016   HIV1RNAQUANT <20 11/26/2015   No components found for: HIV1GENOTYPRPLUS No components found for: THELPERCELL  Assessment: Patient here to re establish care with HIV.  Discussed with patient treatment options and side effects, benefits of treatment, long term outcomes.  I discussed the severity of untreated HIV including higher cancer risk, opportunistic infections, renal failure.  Also discussed needing to use condoms, partner disclosure, necessary vaccines, blood monitoring.  All questions answered.    Plan: 1) restart with Biktarvy and Prezcobix 2) OI prophylaxis with bactrim 3) labs in 3 weeks then follow up with me

## 2019-07-11 NOTE — Telephone Encounter (Addendum)
[facility] Patient Advocate Encounter    Findings of the benefits investigation:   Insurance: Advance- active commercial  Requires specialty medications to be filled at [facility]  [facility] Patient Advocate Encounter   Was successful in obtaining a [employer] copay card for [employer]. This copay card will make the patients copay $0.  The billing information is   I provided her a copay card for Prezcobix to take with her.   [phone] best number for follow-up. She knows to call if she runs into any problems with [facility].  Beulah Gandy, CPhT Specialty Pharmacy Patient [facility] Phone: [phone] Fax: [phone] 07/11/2019 9:46 AM

## 2019-07-12 ENCOUNTER — Encounter: Payer: Self-pay | Admitting: Internal Medicine

## 2019-07-12 NOTE — Assessment & Plan Note (Signed)
She is getting her PAP and mammogram from her PCP

## 2019-08-01 ENCOUNTER — Other Ambulatory Visit: Payer: Self-pay

## 2019-08-01 ENCOUNTER — Other Ambulatory Visit: Payer: No Typology Code available for payment source

## 2019-08-01 DIAGNOSIS — B20 Human immunodeficiency virus [HIV] disease: Secondary | ICD-10-CM

## 2019-08-02 LAB — T-HELPER CELL (CD4) - (RCID CLINIC ONLY)
CD4 % Helper T Cell: 7 % — ABNORMAL LOW (ref 33–65)
CD4 T Cell Abs: 161 /uL — ABNORMAL LOW (ref 400–1790)

## 2019-08-03 LAB — COMPLETE METABOLIC PANEL WITH GFR
AG Ratio: 1 (calc) (ref 1.0–2.5)
ALT: 13 U/L (ref 6–29)
AST: 16 U/L (ref 10–35)
Albumin: 3.9 g/dL (ref 3.6–5.1)
Alkaline phosphatase (APISO): 67 U/L (ref 31–125)
BUN/Creatinine Ratio: 14 (calc) (ref 6–22)
BUN: 21 mg/dL (ref 7–25)
CO2: 17 mmol/L — ABNORMAL LOW (ref 20–32)
Calcium: 8.6 mg/dL (ref 8.6–10.2)
Chloride: 113 mmol/L — ABNORMAL HIGH (ref 98–110)
Creat: 1.55 mg/dL — ABNORMAL HIGH (ref 0.50–1.10)
GFR, Est African American: 45 mL/min/{1.73_m2} — ABNORMAL LOW (ref >=60)
GFR, Est Non African American: 39 mL/min/{1.73_m2} — ABNORMAL LOW (ref >=60)
Globulin: 3.9 g/dL (calc) — ABNORMAL HIGH (ref 1.9–3.7)
Glucose, Bld: 87 mg/dL (ref 65–99)
Potassium: 4.1 mmol/L (ref 3.5–5.3)
Sodium: 136 mmol/L (ref 135–146)
Total Bilirubin: 0.2 mg/dL (ref 0.2–1.2)
Total Protein: 7.8 g/dL (ref 6.1–8.1)

## 2019-08-03 LAB — HIV-1 RNA QUANT-NO REFLEX-BLD
HIV 1 RNA Quant: 29 copies/mL — ABNORMAL HIGH
HIV-1 RNA Quant, Log: 1.46 Log copies/mL — ABNORMAL HIGH

## 2019-08-10 ENCOUNTER — Encounter: Payer: Self-pay | Admitting: Internal Medicine

## 2019-08-10 ENCOUNTER — Other Ambulatory Visit: Payer: Self-pay

## 2019-08-10 ENCOUNTER — Ambulatory Visit (INDEPENDENT_AMBULATORY_CARE_PROVIDER_SITE_OTHER): Payer: No Typology Code available for payment source | Admitting: Internal Medicine

## 2019-08-10 VITALS — BP 122/79 | HR 73 | Temp 98.5°F | Wt 277.8 lb

## 2019-08-10 DIAGNOSIS — B20 Human immunodeficiency virus [HIV] disease: Secondary | ICD-10-CM

## 2019-08-10 DIAGNOSIS — Z5181 Encounter for therapeutic drug level monitoring: Secondary | ICD-10-CM

## 2019-08-10 NOTE — Assessment & Plan Note (Signed)
Creat stable and will continue to monitor

## 2019-08-10 NOTE — Progress Notes (Signed)
   Subjective:    Patient ID: Ariel Cox, female    DOB: 1970/11/18, 49 y.o.   MRN: [id number]  HPI Here for her second visit back after a long abscess. She restarted Biktarvy and tolerating well.  CD4 up to 161 and viral load down to 29.  Feels well and no complaints.  No missed doses since starting.     Review of Systems  Constitutional: Negative for fatigue.  Gastrointestinal: Negative for diarrhea and nausea.       Objective:   Physical Exam Constitutional:      Appearance: Normal appearance.  Eyes:     General: No scleral icterus. Cardiovascular:     Rate and Rhythm: Normal rate.  Neurological:     Mental Status: She is alert.  Psychiatric:        Mood and Affect: Mood normal.           Assessment & Plan:

## 2019-08-10 NOTE — Assessment & Plan Note (Signed)
Symptomatic but improved on medication.  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   rtc 2 months.

## 2019-09-26 ENCOUNTER — Other Ambulatory Visit: Payer: No Typology Code available for payment source

## 2019-09-26 ENCOUNTER — Other Ambulatory Visit: Payer: Self-pay

## 2019-09-26 DIAGNOSIS — B20 Human immunodeficiency virus [HIV] disease: Secondary | ICD-10-CM

## 2019-09-27 LAB — T-HELPER CELL (CD4) - (RCID CLINIC ONLY)
CD4 % Helper T Cell: 7 % — ABNORMAL LOW (ref 33–65)
CD4 T Cell Abs: 134 /uL — ABNORMAL LOW (ref 400–1790)

## 2019-09-29 LAB — HIV-1 RNA QUANT-NO REFLEX-BLD
HIV 1 RNA Quant: 36 Copies/mL — ABNORMAL HIGH
HIV-1 RNA Quant, Log: 1.56 Log cps/mL — ABNORMAL HIGH

## 2019-10-11 ENCOUNTER — Telehealth: Payer: Self-pay | Admitting: *Deleted

## 2019-10-11 NOTE — Telephone Encounter (Signed)
Patient called to change her appointment 10/12/19.  She has had labs drawn 8/9, had covid exposure over the weekend.  RN offered televisit for 8/25. Patient agreed. Andree Coss, RN

## 2019-10-12 ENCOUNTER — Other Ambulatory Visit: Payer: Self-pay

## 2019-10-12 ENCOUNTER — Telehealth (INDEPENDENT_AMBULATORY_CARE_PROVIDER_SITE_OTHER): Payer: No Typology Code available for payment source | Admitting: Internal Medicine

## 2019-10-12 ENCOUNTER — Encounter: Payer: Self-pay | Admitting: Internal Medicine

## 2019-10-12 DIAGNOSIS — B20 Human immunodeficiency virus [HIV] disease: Secondary | ICD-10-CM | POA: Diagnosis not present

## 2019-10-12 NOTE — Progress Notes (Signed)
   Subjective:    Patient ID: Ariel Cox, female    DOB: December 20, 1970, 49 y.o.   MRN: [id number]  HPI I connected with  Ariel Cox on 10/12/19 by a video enabled telemedicine application and verified that I am speaking with the correct person using two identifiers.  Spoke via video.   I discussed the limitations of evaluation and management by telemedicine. The patient expressed understanding and agreed to proceed. 12 minutes spent on the video Location: Patient: home Physician: Clinic    Review of Systems  Constitutional: Negative for fatigue.  Gastrointestinal: Negative for diarrhea and nausea.  Skin: Negative for rash.       Objective:   Physical Exam Eyes:     General: No scleral icterus. Neurological:     Mental Status: She is alert.  Psychiatric:        Mood and Affect: Mood normal.           Assessment & Plan:

## 2019-10-12 NOTE — Assessment & Plan Note (Signed)
She continues to do well though no improvement in her CD4 count.  Slow to have immune reconstitution after starting so low.  Virus remains suppressed though.   Continue with Biktarvy and rtc 3 months. 12 minutes spent on the video

## 2019-12-16 ENCOUNTER — Other Ambulatory Visit: Payer: Self-pay | Admitting: Pharmacist

## 2019-12-16 DIAGNOSIS — B20 Human immunodeficiency virus [HIV] disease: Secondary | ICD-10-CM

## 2020-02-08 ENCOUNTER — Other Ambulatory Visit: Payer: Self-pay

## 2020-02-08 DIAGNOSIS — B20 Human immunodeficiency virus [HIV] disease: Secondary | ICD-10-CM

## 2020-02-08 MED ORDER — SULFAMETHOXAZOLE-TRIMETHOPRIM 800-160 MG PO TABS: 1.0000 | ORAL_TABLET | Freq: Every day | ORAL | 0 refills | Status: DC

## 2020-02-13 ENCOUNTER — Other Ambulatory Visit: Payer: No Typology Code available for payment source

## 2020-02-13 ENCOUNTER — Other Ambulatory Visit: Payer: Self-pay

## 2020-02-13 DIAGNOSIS — B20 Human immunodeficiency virus [HIV] disease: Secondary | ICD-10-CM

## 2020-02-14 LAB — T-HELPER CELL (CD4) - (RCID CLINIC ONLY)
CD4 % Helper T Cell: 10 % — ABNORMAL LOW (ref 33–65)
CD4 T Cell Abs: 179 /uL — ABNORMAL LOW (ref 400–1790)

## 2020-02-24 LAB — HIV-1 RNA QUANT-NO REFLEX-BLD
HIV 1 RNA Quant: <20 Copies/mL
HIV-1 RNA Quant, Log: <1.3 Log cps/mL

## 2020-03-05 ENCOUNTER — Telehealth (INDEPENDENT_AMBULATORY_CARE_PROVIDER_SITE_OTHER): Payer: No Typology Code available for payment source | Admitting: Internal Medicine

## 2020-03-05 ENCOUNTER — Encounter: Payer: Self-pay | Admitting: Internal Medicine

## 2020-03-05 ENCOUNTER — Other Ambulatory Visit: Payer: Self-pay

## 2020-03-05 DIAGNOSIS — N182 Chronic kidney disease, stage 2 (mild): Secondary | ICD-10-CM | POA: Diagnosis not present

## 2020-03-05 DIAGNOSIS — Z9189 Other specified personal risk factors, not elsewhere classified: Secondary | ICD-10-CM

## 2020-03-05 DIAGNOSIS — B20 Human immunodeficiency virus [HIV] disease: Secondary | ICD-10-CM | POA: Diagnosis not present

## 2020-03-05 DIAGNOSIS — N189 Chronic kidney disease, unspecified: Secondary | ICD-10-CM | POA: Insufficient documentation

## 2020-03-05 MED ORDER — BIKTARVY 50-200-25 MG PO TABS: 1.0000 | ORAL_TABLET | Freq: Every day | ORAL | 5 refills | Status: DC

## 2020-03-05 MED ORDER — SULFAMETHOXAZOLE-TRIMETHOPRIM 800-160 MG PO TABS: 1.0000 | ORAL_TABLET | Freq: Every day | ORAL | 5 refills | Status: DC

## 2020-03-05 MED ORDER — PREZCOBIX 800-150 MG PO TABS
ORAL_TABLET | ORAL | 5 refills | Status: DC
Start: 2020-03-05 — End: 2020-07-05

## 2020-03-05 NOTE — Assessment & Plan Note (Addendum)
Creat last check was mildly elevated.  Will continue to monitor and consider referral if worsens She has a home BP monitor and will check She is avoiding NSAIDS

## 2020-03-05 NOTE — Assessment & Plan Note (Signed)
She continues to do well on the salvage regimen with no issues.  CD4 is slow to respond and remains under 200.  She is otherwise doing well.   She has noted some lymphadenopathy in her neck.  She can rtc in 4 months for this, Will see her next week for the lymphadenopathy

## 2020-03-05 NOTE — Assessment & Plan Note (Signed)
She will continue with Bactrim with her CD4 < 200.

## 2020-03-05 NOTE — Progress Notes (Signed)
   Subjective:    Patient ID: Ariel Cox, female    DOB: Nov 05, 1970, 50 y.o.   MRN: [id number]  HPI I connected with  Margean Korell on 03/05/20 by a video enabled telemedicine application and verified that I am speaking with the correct person using two identifiers.  Spoke via video.   I discussed the limitations of evaluation and management by telemedicine. The patient expressed understanding and agreed to proceed. 22 minutes spent on the video Location: Patient: home Physician: Home office    Review of Systems  Constitutional: Negative for appetite change and unexpected weight change.  Gastrointestinal: Negative for diarrhea and nausea.  Skin: Negative for rash.       Objective:   Physical Exam Eyes:     General: No scleral icterus. Neurological:     Mental Status: She is alert.  Psychiatric:        Mood and Affect: Mood normal.           Assessment & Plan:

## 2020-03-14 ENCOUNTER — Encounter: Payer: Self-pay | Admitting: Internal Medicine

## 2020-03-14 ENCOUNTER — Ambulatory Visit (INDEPENDENT_AMBULATORY_CARE_PROVIDER_SITE_OTHER): Payer: No Typology Code available for payment source | Admitting: Internal Medicine

## 2020-03-14 ENCOUNTER — Other Ambulatory Visit: Payer: Self-pay

## 2020-03-14 VITALS — BP 132/89 | HR 73 | Temp 97.9°F | Wt 292.6 lb

## 2020-03-14 DIAGNOSIS — B20 Human immunodeficiency virus [HIV] disease: Secondary | ICD-10-CM

## 2020-03-14 DIAGNOSIS — Z79899 Other long term (current) drug therapy: Secondary | ICD-10-CM

## 2020-03-14 DIAGNOSIS — R590 Localized enlarged lymph nodes: Secondary | ICD-10-CM | POA: Insufficient documentation

## 2020-03-14 DIAGNOSIS — Z113 Encounter for screening for infections with a predominantly sexual mode of transmission: Secondary | ICD-10-CM | POA: Diagnosis not present

## 2020-03-14 DIAGNOSIS — N182 Chronic kidney disease, stage 2 (mild): Secondary | ICD-10-CM

## 2020-03-14 NOTE — Assessment & Plan Note (Signed)
Will recheck her renal function today

## 2020-03-14 NOTE — Assessment & Plan Note (Signed)
Her CD4 is slowly building up and she remains on her medication.

## 2020-03-14 NOTE — Progress Notes (Signed)
   Subjective:    Patient ID: Ariel Cox, female    DOB: 09-04-1970, 50 y.o.   MRN: [id number]  HPI Here for evaluation of new lymphadenopathy She noticed this about three weeks ago and never had anything before.  No associated fever or weight loss.  It has been shrinking the last week.  No other sites she has noted.  No dental issues, no skin issues.     Review of Systems  Constitutional: Negative for activity change, chills, fatigue, fever and unexpected weight change.  Gastrointestinal: Negative for diarrhea and nausea.  Skin: Negative for rash.       Objective:   Physical Exam Constitutional:      Appearance: Normal appearance.  Chest:  Breasts:     Right: No supraclavicular adenopathy.     Left: No supraclavicular adenopathy.    Lymphadenopathy:     Head:     Right side of head: No submental, submandibular, tonsillar, preauricular, posterior auricular or occipital adenopathy.     Left side of head: Posterior auricular adenopathy present. No submental, submandibular, tonsillar, preauricular or occipital adenopathy.     Cervical: No cervical adenopathy.     Upper Body:     Right upper body: No supraclavicular adenopathy.     Left upper body: No supraclavicular adenopathy.  Neurological:     Mental Status: She is alert.   SH: no tobacco        Assessment & Plan:

## 2020-03-14 NOTE — Assessment & Plan Note (Addendum)
This is new and posterior auricular.  The fact that it is resolving on its own is reassuring.  I discussed that it should continue to reduce over the next 2-3 weeks but if it enlarges at any time or does not resolve in 2 weeks or so, then further evaluation with a CT of the head/neck and biopsy of the area would be the next step.  With her HIV and getting back in care, it is likely reactive to her improving immune system.  She will call with any concerns as listed above.  Will check CBC and LDH today

## 2020-03-15 LAB — CBC WITH DIFFERENTIAL/PLATELET
Absolute Monocytes: 459 cells/uL (ref 200–950)
Basophils Absolute: 30 cells/uL (ref 0–200)
Basophils Relative: 0.8 %
Eosinophils Absolute: 229 cells/uL (ref 15–500)
Eosinophils Relative: 6.2 %
HCT: 31.1 % — ABNORMAL LOW (ref 35.0–45.0)
Hemoglobin: 10.2 g/dL — ABNORMAL LOW (ref 11.7–15.5)
Lymphs Abs: 1802 cells/uL (ref 850–3900)
MCH: 28 pg (ref 27.0–33.0)
MCHC: 32.8 g/dL (ref 32.0–36.0)
MCV: 85.4 fL (ref 80.0–100.0)
MPV: 9.6 fL (ref 7.5–12.5)
Monocytes Relative: 12.4 %
Neutro Abs: 1180 cells/uL — ABNORMAL LOW (ref 1500–7800)
Neutrophils Relative %: 31.9 %
Platelets: 242 10*3/uL (ref 140–400)
RBC: 3.64 10*6/uL — ABNORMAL LOW (ref 3.80–5.10)
RDW: 14.5 % (ref 11.0–15.0)
Total Lymphocyte: 48.7 %
WBC: 3.7 10*3/uL — ABNORMAL LOW (ref 3.8–10.8)

## 2020-03-15 LAB — COMPREHENSIVE METABOLIC PANEL
AG Ratio: 1.2 (calc) (ref 1.0–2.5)
ALT: 18 U/L (ref 6–29)
AST: 19 U/L (ref 10–35)
Albumin: 3.8 g/dL (ref 3.6–5.1)
Alkaline phosphatase (APISO): 72 U/L (ref 37–153)
BUN/Creatinine Ratio: 12 (calc) (ref 6–22)
BUN: 19 mg/dL (ref 7–25)
CO2: 23 mmol/L (ref 20–32)
Calcium: 8.5 mg/dL — ABNORMAL LOW (ref 8.6–10.4)
Chloride: 108 mmol/L (ref 98–110)
Creat: 1.6 mg/dL — ABNORMAL HIGH (ref 0.50–1.05)
Globulin: 3.1 g/dL (calc) (ref 1.9–3.7)
Glucose, Bld: 84 mg/dL (ref 65–99)
Potassium: 4.8 mmol/L (ref 3.5–5.3)
Sodium: 136 mmol/L (ref 135–146)
Total Bilirubin: 0.3 mg/dL (ref 0.2–1.2)
Total Protein: 6.9 g/dL (ref 6.1–8.1)

## 2020-03-15 LAB — LACTATE DEHYDROGENASE: LDH: 113 U/L — ABNORMAL LOW (ref 120–250)

## 2020-06-14 ENCOUNTER — Other Ambulatory Visit: Payer: Self-pay | Admitting: Internal Medicine

## 2020-06-14 DIAGNOSIS — B20 Human immunodeficiency virus [HIV] disease: Secondary | ICD-10-CM

## 2020-07-02 ENCOUNTER — Other Ambulatory Visit: Payer: No Typology Code available for payment source

## 2020-07-02 ENCOUNTER — Other Ambulatory Visit: Payer: Self-pay

## 2020-07-02 DIAGNOSIS — B20 Human immunodeficiency virus [HIV] disease: Secondary | ICD-10-CM

## 2020-07-02 DIAGNOSIS — Z79899 Other long term (current) drug therapy: Secondary | ICD-10-CM

## 2020-07-02 DIAGNOSIS — Z113 Encounter for screening for infections with a predominantly sexual mode of transmission: Secondary | ICD-10-CM

## 2020-07-03 LAB — T-HELPER CELL (CD4) - (RCID CLINIC ONLY)
CD4 % Helper T Cell: 11 % — ABNORMAL LOW (ref 33–65)
CD4 T Cell Abs: 156 /uL — ABNORMAL LOW (ref 400–1790)

## 2020-07-04 ENCOUNTER — Other Ambulatory Visit: Payer: Self-pay | Admitting: Internal Medicine

## 2020-07-04 DIAGNOSIS — B20 Human immunodeficiency virus [HIV] disease: Secondary | ICD-10-CM

## 2020-07-05 LAB — CBC WITH DIFFERENTIAL/PLATELET
Absolute Monocytes: 525 cells/uL (ref 200–950)
Basophils Absolute: 38 cells/uL (ref 0–200)
Basophils Relative: 0.9 %
Eosinophils Absolute: 227 cells/uL (ref 15–500)
Eosinophils Relative: 5.4 %
HCT: 33.7 % — ABNORMAL LOW (ref 35.0–45.0)
Hemoglobin: 10.7 g/dL — ABNORMAL LOW (ref 11.7–15.5)
Lymphs Abs: 1449 cells/uL (ref 850–3900)
MCH: 27.9 pg (ref 27.0–33.0)
MCHC: 31.8 g/dL — ABNORMAL LOW (ref 32.0–36.0)
MCV: 87.8 fL (ref 80.0–100.0)
MPV: 9.8 fL (ref 7.5–12.5)
Monocytes Relative: 12.5 %
Neutro Abs: 1961 cells/uL (ref 1500–7800)
Neutrophils Relative %: 46.7 %
Platelets: 286 10*3/uL (ref 140–400)
RBC: 3.84 10*6/uL (ref 3.80–5.10)
RDW: 13.5 % (ref 11.0–15.0)
Total Lymphocyte: 34.5 %
WBC: 4.2 10*3/uL (ref 3.8–10.8)

## 2020-07-05 LAB — COMPLETE METABOLIC PANEL WITH GFR
AG Ratio: 1.2 (calc) (ref 1.0–2.5)
ALT: 14 U/L (ref 6–29)
AST: 17 U/L (ref 10–35)
Albumin: 3.8 g/dL (ref 3.6–5.1)
Alkaline phosphatase (APISO): 58 U/L (ref 37–153)
BUN/Creatinine Ratio: 12 (calc) (ref 6–22)
BUN: 19 mg/dL (ref 7–25)
CO2: 21 mmol/L (ref 20–32)
Calcium: 8.5 mg/dL — ABNORMAL LOW (ref 8.6–10.4)
Chloride: 110 mmol/L (ref 98–110)
Creat: 1.6 mg/dL — ABNORMAL HIGH (ref 0.50–1.05)
GFR, Est African American: 43 mL/min/{1.73_m2} — ABNORMAL LOW (ref >=60)
GFR, Est Non African American: 37 mL/min/{1.73_m2} — ABNORMAL LOW (ref >=60)
Globulin: 3.1 g/dL (calc) (ref 1.9–3.7)
Glucose, Bld: 68 mg/dL (ref 65–99)
Potassium: 4.3 mmol/L (ref 3.5–5.3)
Sodium: 137 mmol/L (ref 135–146)
Total Bilirubin: 0.3 mg/dL (ref 0.2–1.2)
Total Protein: 6.9 g/dL (ref 6.1–8.1)

## 2020-07-05 LAB — LIPID PANEL
Cholesterol: 200 mg/dL — ABNORMAL HIGH (ref <200)
HDL: 58 mg/dL (ref >=50)
LDL Cholesterol (Calc): 126 mg/dL (calc) — ABNORMAL HIGH
Non-HDL Cholesterol (Calc): 142 mg/dL (calc) — ABNORMAL HIGH (ref <130)
Total CHOL/HDL Ratio: 3.4 (calc) (ref <5.0)
Triglycerides: 64 mg/dL (ref <150)

## 2020-07-05 LAB — RPR: RPR Ser Ql: NONREACTIVE

## 2020-07-05 LAB — HIV-1 RNA QUANT-NO REFLEX-BLD
HIV 1 RNA Quant: NOT DETECTED Copies/mL
HIV-1 RNA Quant, Log: NOT DETECTED Log cps/mL

## 2020-07-07 IMAGING — MG MM DIGITAL SCREENING BILAT W/ TOMO W/ CAD
8 of 15 series · 8 of 40 positions shown · non-contrast
Comparison: Previous exam(s).

CLINICAL DATA: Screening.

EXAM:
DIGITAL SCREENING BILATERAL MAMMOGRAM WITH TOMO AND CAD

[R MLO synth-2D (1 of 2)]
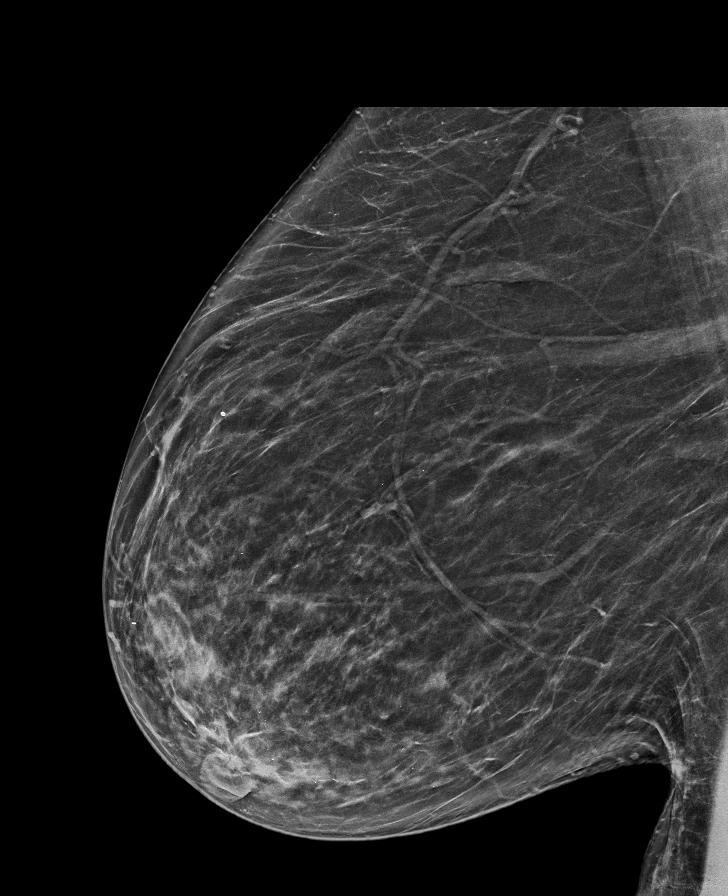

[R CC synth-2D (1 of 2)]
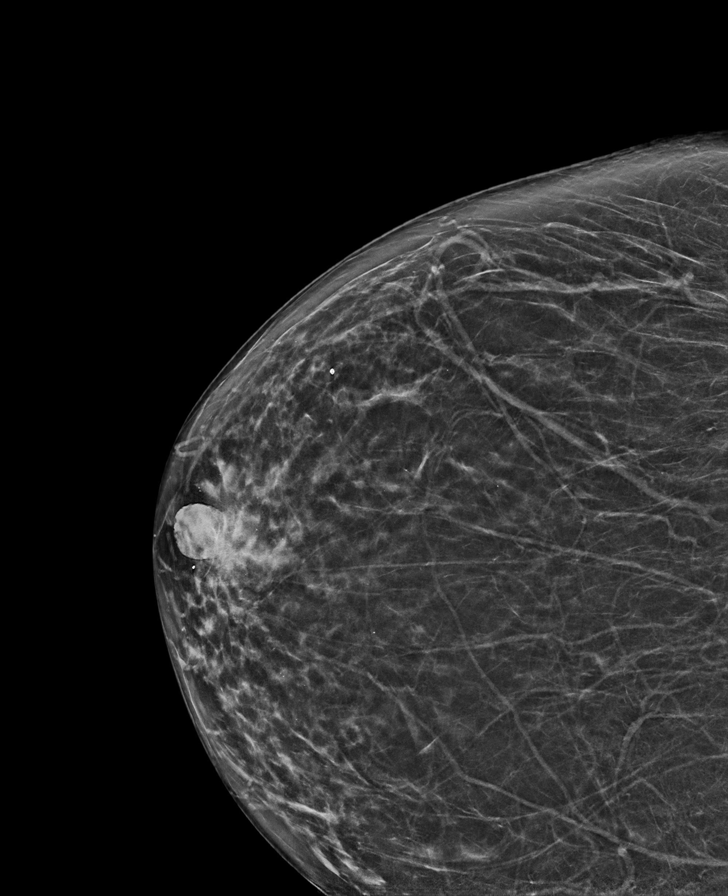

[R MLO synth-2D (2 of 2)]
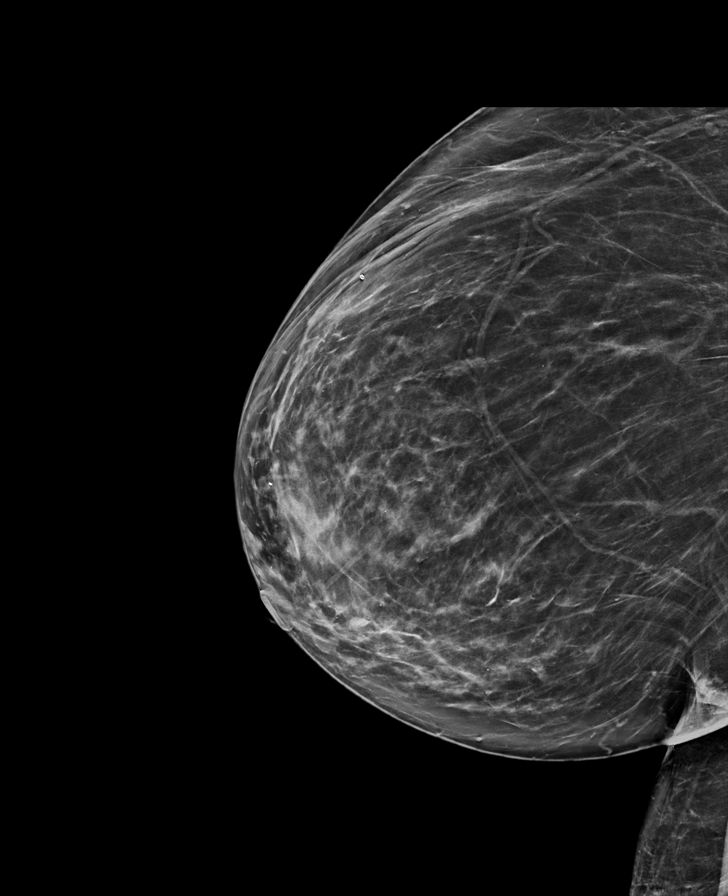

[R CC synth-2D (2 of 2)]
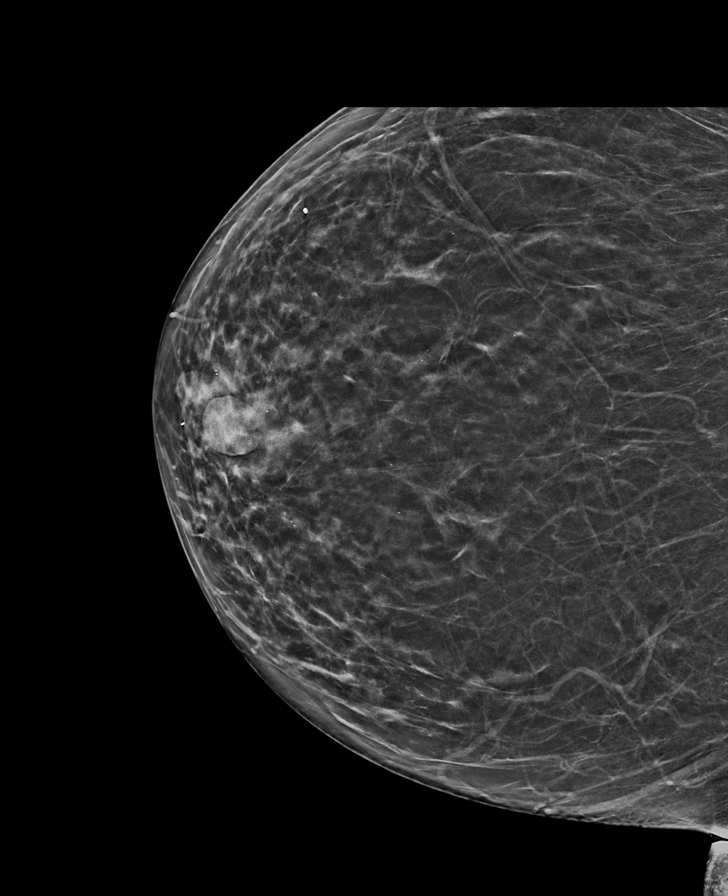

[L CC synth-2D]
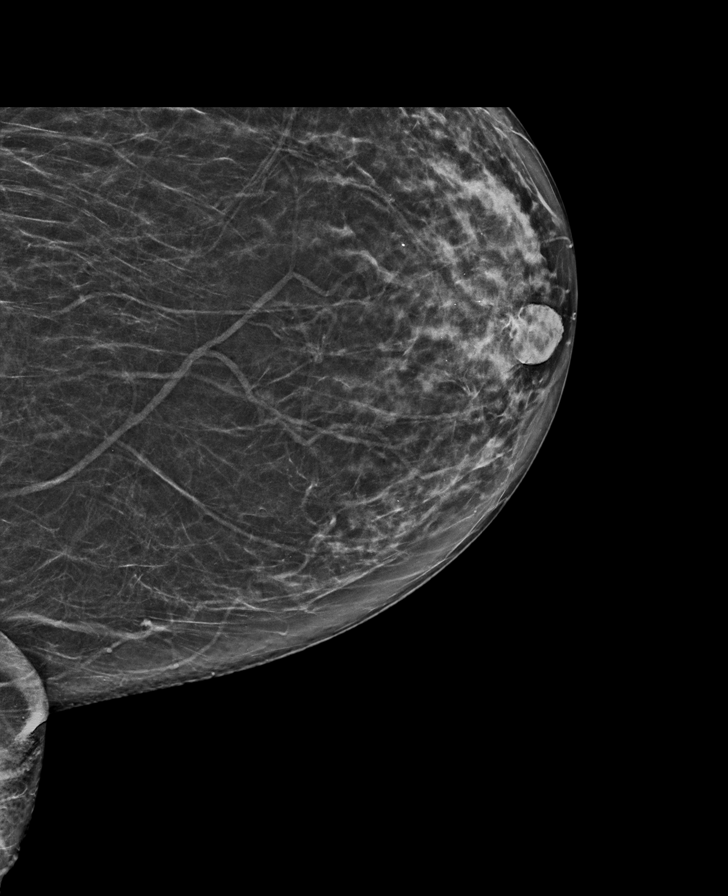

[L MLO synth-2D (1 of 2)]
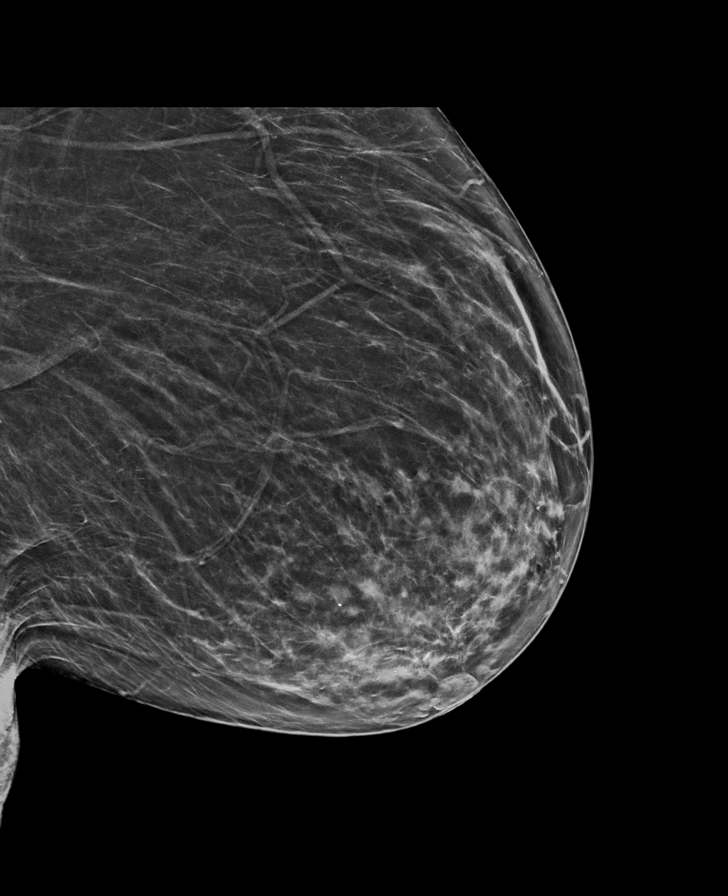

[L MLO synth-2D (2 of 2)]
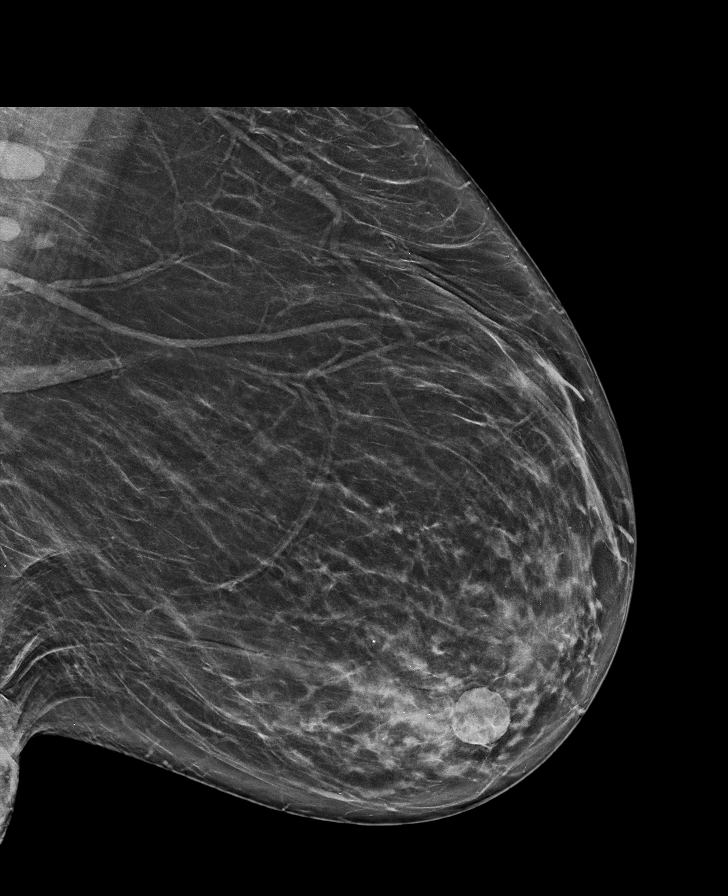

[L MLO tomo · tomo slice 55/81.0]
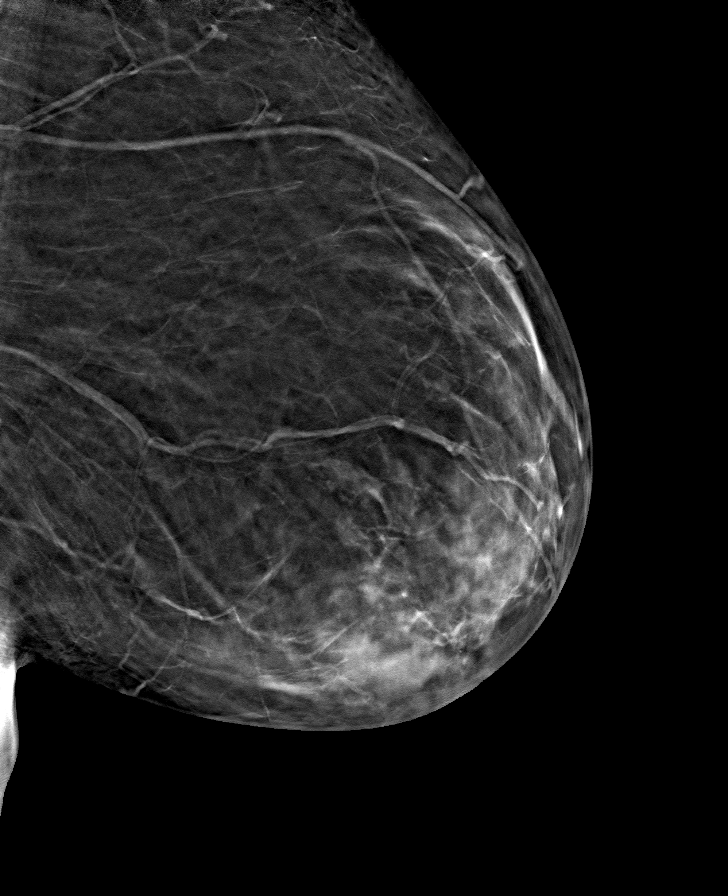

[8 of 40 positions shown; findings below may reference images not displayed]

ACR Breast Density Category b: There are scattered areas of
fibroglandular density.
FINDINGS: There are no findings suspicious for malignancy. Images were
processed with CAD.
IMPRESSION: No mammographic evidence of malignancy. A result letter of this
screening mammogram will be mailed directly to the patient.

RECOMMENDATION:
Screening mammogram in one year. (Code:[id number])

BI-RADS CATEGORY  1: Negative.

## 2020-07-17 ENCOUNTER — Ambulatory Visit (INDEPENDENT_AMBULATORY_CARE_PROVIDER_SITE_OTHER): Payer: No Typology Code available for payment source | Admitting: Infectious Diseases

## 2020-07-17 ENCOUNTER — Encounter: Payer: No Typology Code available for payment source | Admitting: Internal Medicine

## 2020-07-17 DIAGNOSIS — B20 Human immunodeficiency virus [HIV] disease: Secondary | ICD-10-CM

## 2020-07-17 DIAGNOSIS — R7989 Other specified abnormal findings of blood chemistry: Secondary | ICD-10-CM | POA: Diagnosis not present

## 2020-07-17 DIAGNOSIS — Z9189 Other specified personal risk factors, not elsewhere classified: Secondary | ICD-10-CM

## 2020-07-17 DIAGNOSIS — I10 Essential (primary) hypertension: Secondary | ICD-10-CM | POA: Diagnosis not present

## 2020-07-17 MED ORDER — SULFAMETHOXAZOLE-TRIMETHOPRIM 800-160 MG PO TABS: 1.0000 | ORAL_TABLET | Freq: Every day | ORAL | 5 refills | Status: DC

## 2020-07-17 NOTE — Assessment & Plan Note (Signed)
Has in the past required treatment. Last BP in January 2022 was normal range for non-diabetic patient.  She does have some CKD - advised good BP control is important to prevent further issues with kidney function.

## 2020-07-17 NOTE — Progress Notes (Signed)
Name: Ariel Cox  DOB: Jun 12, 1970 MRN: [id number] PCP: Patient, No Pcp Per (Inactive)   VIRTUAL CARE ENCOUNTER  I connected with Beather Arbour on 07/17/20 at  3:00 PM EDT by TELEPHONE and verified that I am speaking with the correct person using two identifiers.   I discussed the limitations, risks, security and privacy concerns of performing an evaluation and management service by telephone and the availability of in person appointments. I also discussed with the patient that there may be a patient responsible charge related to this service. The patient expressed understanding and agreed to proceed.  Patient Location: [place] residence   Other Participants: [place] residence   Provider Location: [facility] Office    Subjective:   Chief Complaint  Patient presents with  . Follow-up    4 month follow up , discuss lab  results      HPI: Here for follow up for HIV care - routinely sees Dr. Luciana Axe but was not able to make in person appt recently due to surgery. She continues her biktarvy + prezcobix once daily as prescribed. No concern for side effect or intolerance or access to her medication.   Was taking a lot of NSAIDS at one point but stopped. Maternal diabetes but not with her personally. No problems with BP, has historically been on medications for this however.  Looking to establish with PCP.   Wondering what she needs to do to stay healthy and prevent from getting sick.  She had the shingles vaccine before - but thinks she only got 1 shot.    BP Readings from Last 3 Encounters:  03/14/20 132/89  08/10/19 122/79  07/11/19 134/88     Depression screen PHQ 2/9 10/12/2019  Decreased Interest 0  Down, Depressed, Hopeless 0  PHQ - 2 Score 0  Altered sleeping -  Tired, decreased energy -  Change in appetite -  Feeling bad or failure about yourself  -  Trouble concentrating -  Moving slowly or fidgety/restless -  Suicidal thoughts -  PHQ-9 Score -     Review of  Systems  Constitutional: Negative for chills, fever, malaise/fatigue and weight loss.  HENT: Negative for sore throat.        No dental problems  Respiratory: Negative for cough and sputum production.   Cardiovascular: Negative for chest pain and leg swelling.  Gastrointestinal: Negative for abdominal pain, diarrhea and vomiting.  Genitourinary: Negative for dysuria and flank pain.  Musculoskeletal: Negative for joint pain, myalgias and neck pain.  Skin: Negative for rash.  Neurological: Negative for dizziness, tingling and headaches.  Psychiatric/Behavioral: Negative for depression and substance abuse. The patient is not nervous/anxious and does not have insomnia.     Past Medical History:  Diagnosis Date  . Depression   . Esophageal stricture   . GERD (gastroesophageal reflux disease)   . HIV infection (HCC)   . Hypertension     Outpatient Medications Prior to Visit  Medication Sig Dispense Refill  . acetaminophen (TYLENOL) 500 MG tablet Take 500 mg by mouth in the morning, at noon, and at bedtime.    Marland Kitchen BIKTARVY 50-200-25 MG TABS tablet TAKE 1 TABLET BY MOUTH DAILY. 30 tablet 4  . omeprazole (PRILOSEC) 20 MG capsule Take 20 mg by mouth daily.    . penicillin v potassium (VEETID) 500 MG tablet Take 500 mg by mouth every 8 (eight) hours.    Marland Kitchen PREZCOBIX 800-150 MG tablet TAKE 1 TABLET BY MOUTH DAILY WITH BREAKFAST. SWALLOW WHOLE.  DO NOT CRUSH, BREAK OR CHEW TABLETS. TAKE WITH FOOD. 30 tablet 4  . sulfamethoxazole-trimethoprim (BACTRIM DS) 800-160 MG tablet Take 1 tablet by mouth daily. 30 tablet 5   No facility-administered medications prior to visit.     Allergies  Allergen Reactions  . Shellfish Allergy Rash    Social History   Tobacco Use  . Smoking status: Never Smoker  . Smokeless tobacco: Never Used  Vaping Use  . Vaping Use: Never used  Substance Use Topics  . Alcohol use: Yes    Comment: rare  . Drug use: No    Family History  Problem Relation Age of Onset   . Breast cancer Maternal Aunt   . Hypertension Father   . Diabetes Maternal Grandmother   . Cancer Paternal Grandmother        Pancreatic  . Cancer Maternal Grandfather        lung    Social History   Substance and Sexual Activity  Sexual Activity Not Currently  . Partners: Male  . Birth control/protection: None   Comment: pt. given condoms     Objective:  There were no vitals filed for this visit. There is no height or weight on file to calculate BMI.  Physical Exam Vitals reviewed.  Pulmonary:     Effort: Pulmonary effort is normal.     Comments: No shortness of breath detected in conversation.  Neurological:     Mental Status: She is oriented to person, place, and time.  Psychiatric:        Mood and Affect: Mood normal.        Behavior: Behavior normal.        Thought Content: Thought content normal.        Judgment: Judgment normal.     Lab Results Lab Results  Component Value Date   WBC 4.2 07/02/2020   HGB 10.7 (L) 07/02/2020   HCT 33.7 (L) 07/02/2020   MCV 87.8 07/02/2020   PLT 286 07/02/2020    Lab Results  Component Value Date   CREATININE 1.60 (H) 07/02/2020   BUN 19 07/02/2020   NA 137 07/02/2020   K 4.3 07/02/2020   CL 110 07/02/2020   CO2 21 07/02/2020    Lab Results  Component Value Date   ALT 14 07/02/2020   AST 17 07/02/2020   ALKPHOS 67 04/22/2016   BILITOT 0.3 07/02/2020    Lab Results  Component Value Date   CHOL 200 (H) 07/02/2020   HDL 58 07/02/2020   LDLCALC 126 (H) 07/02/2020   TRIG 64 07/02/2020   CHOLHDL 3.4 07/02/2020   HIV 1 RNA Quant (Copies/mL)  Date Value  07/02/2020 Not Detected  02/13/2020 <20  09/26/2019 36 (H)   CD4 T Cell Abs (/uL)  Date Value  07/02/2020 156 (L)  02/13/2020 179 (L)  09/26/2019 134 (L)     Assessment & Plan:   Problem List Items Addressed This Visit      Unprioritized   Essential hypertension, benign    Has in the past required treatment. Last BP in January 2022 was normal  range for non-diabetic patient.  She does have some CKD - advised good BP control is important to prevent further issues with kidney function.       Elevated serum creatinine    Lab Results  Component Value Date   CREATININE 1.60 (H) 07/02/2020   CREATININE 1.60 (H) 03/14/2020   CREATININE 1.55 (H) 08/01/2019   Stable chronic kidney disease, stage 3.  I don't think TAF is contributing as this was elevated in the past but counseled with Ms. Edelman we will need to watch this for her in the future. Needs diabetes screening (family history). Will help her get set up with PCP at her upcoming pap smear visit with me.       At risk for opportunistic infections    We talked about continuing her Bactrim once daily and the intention behind this. I will refill for 6 months.  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  Has had 1 dose of shingles vaccine - recommend she contact her pharmacy for 2nd dose of shingrix  Recommended 4th shot of mRNA COVID vaccine.  Thoroughly cook meats, wash vegetables, hand hygiene etc.  Staying on ARVs will be the biggest risk reducing action she can take, which she plans to do.         Follow Up Instructions: Schedule PAP smear with me at your convenience.  Help to establish with PCP.    I discussed the assessment and treatment plan with the patient. The patient was provided an opportunity to ask questions and all were answered. The patient agreed with the plan and demonstrated an understanding of the instructions.   The patient was advised to call back or seek an in-person evaluation if the symptoms worsen or if the condition fails to improve as anticipated.  I provided 16 minutes of non-face-to-face time during this encounter.   Rexene Alberts, MSN, NP-C [facility]  [email or web address] Pager: [phone] Office: [phone] [facility] Main Line:  [phone]   07/17/20  3:09 PM

## 2020-07-17 NOTE — Assessment & Plan Note (Signed)
We talked about continuing her Bactrim once daily and the intention behind this. I will refill for 6 months.  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  Has had 1 dose of shingles vaccine - recommend she contact her pharmacy for 2nd dose of shingrix  Recommended 4th shot of mRNA COVID vaccine.  Thoroughly cook meats, wash vegetables, hand hygiene etc.  Staying on ARVs will be the biggest risk reducing action she can take, which she plans to do.

## 2020-07-17 NOTE — Assessment & Plan Note (Signed)
Lab Results  Component Value Date   CREATININE 1.60 (H) 07/02/2020   CREATININE 1.60 (H) 03/14/2020   CREATININE 1.55 (H) 08/01/2019   Stable chronic kidney disease, stage 3. I don't think TAF is contributing as this was elevated in the past but counseled with Ariel Cox we will need to watch this for her in the future. Needs diabetes screening (family history). Will help her get set up with PCP at her upcoming pap smear visit with me.

## 2020-07-17 NOTE — Telephone Encounter (Signed)
Patient called due to missing this mornings follow up visit with Dr. Luciana Axe. Patient states she could not find any parking. Patient is also concerned because as of tomorrow new insurance will be in effect. Patient is requesting a virtual visit today.  Patient offered virtual  today with available provider , [place]. Patient accepts appointment.  Valarie Cones

## 2020-10-29 ENCOUNTER — Other Ambulatory Visit: Payer: Self-pay

## 2020-10-29 DIAGNOSIS — B20 Human immunodeficiency virus [HIV] disease: Secondary | ICD-10-CM

## 2020-11-02 ENCOUNTER — Other Ambulatory Visit: Payer: Self-pay

## 2020-11-02 ENCOUNTER — Other Ambulatory Visit: Payer: Self-pay | Admitting: Physician Assistant

## 2020-11-02 ENCOUNTER — Other Ambulatory Visit: Payer: No Typology Code available for payment source

## 2020-11-02 ENCOUNTER — Other Ambulatory Visit (HOSPITAL_COMMUNITY)
Admission: RE | Admit: 2020-11-02 | Discharge: 2020-11-02 | Disposition: A | Discharge status: Home / Self Care | Payer: No Typology Code available for payment source | Source: Ambulatory Visit | Attending: Physician Assistant | Admitting: Physician Assistant

## 2020-11-02 DIAGNOSIS — Z124 Encounter for screening for malignant neoplasm of cervix: Secondary | ICD-10-CM | POA: Diagnosis not present

## 2020-11-02 DIAGNOSIS — B20 Human immunodeficiency virus [HIV] disease: Secondary | ICD-10-CM

## 2020-11-05 LAB — CBC WITH DIFFERENTIAL/PLATELET
Absolute Monocytes: 1101 cells/uL — ABNORMAL HIGH (ref 200–950)
Basophils Absolute: 32 cells/uL (ref 0–200)
Basophils Relative: 0.5 %
Eosinophils Absolute: 38 cells/uL (ref 15–500)
Eosinophils Relative: 0.6 %
HCT: 37.7 % (ref 35.0–45.0)
Hemoglobin: 12 g/dL (ref 11.7–15.5)
Lymphs Abs: 1139 cells/uL (ref 850–3900)
MCH: 27.9 pg (ref 27.0–33.0)
MCHC: 31.8 g/dL — ABNORMAL LOW (ref 32.0–36.0)
MCV: 87.7 fL (ref 80.0–100.0)
MPV: 10 fL (ref 7.5–12.5)
Monocytes Relative: 17.2 %
Neutro Abs: 4090 cells/uL (ref 1500–7800)
Neutrophils Relative %: 63.9 %
Platelets: 244 10*3/uL (ref 140–400)
RBC: 4.3 10*6/uL (ref 3.80–5.10)
RDW: 12.3 % (ref 11.0–15.0)
Total Lymphocyte: 17.8 %
WBC: 6.4 10*3/uL (ref 3.8–10.8)

## 2020-11-05 LAB — COMPREHENSIVE METABOLIC PANEL
AG Ratio: 1.3 (calc) (ref 1.0–2.5)
ALT: 20 U/L (ref 6–29)
AST: 17 U/L (ref 10–35)
Albumin: 3.7 g/dL (ref 3.6–5.1)
Alkaline phosphatase (APISO): 57 U/L (ref 37–153)
BUN/Creatinine Ratio: 9 (calc) (ref 6–22)
BUN: 12 mg/dL (ref 7–25)
CO2: 28 mmol/L (ref 20–32)
Calcium: 8.4 mg/dL — ABNORMAL LOW (ref 8.6–10.4)
Chloride: 105 mmol/L (ref 98–110)
Creat: 1.39 mg/dL — ABNORMAL HIGH (ref 0.50–1.03)
Globulin: 2.8 g/dL (calc) (ref 1.9–3.7)
Glucose, Bld: 89 mg/dL (ref 65–99)
Potassium: 4 mmol/L (ref 3.5–5.3)
Sodium: 139 mmol/L (ref 135–146)
Total Bilirubin: 0.2 mg/dL (ref 0.2–1.2)
Total Protein: 6.5 g/dL (ref 6.1–8.1)

## 2020-11-05 LAB — T-HELPER CELLS (CD4) COUNT (NOT AT ARMC)
Absolute CD4: 121 cells/uL — ABNORMAL LOW (ref 490–1740)
CD4 T Helper %: 11 % — ABNORMAL LOW (ref 30–61)
Total lymphocyte count: 1063 cells/uL (ref 850–3900)

## 2020-11-05 LAB — HIV-1 RNA QUANT-NO REFLEX-BLD
HIV 1 RNA Quant: NOT DETECTED Copies/mL
HIV-1 RNA Quant, Log: NOT DETECTED Log cps/mL

## 2020-11-07 LAB — CYTOLOGY - PAP: Diagnosis: NEGATIVE

## 2020-11-26 ENCOUNTER — Encounter: Payer: Self-pay | Admitting: Internal Medicine

## 2020-11-26 ENCOUNTER — Ambulatory Visit (INDEPENDENT_AMBULATORY_CARE_PROVIDER_SITE_OTHER): Payer: No Typology Code available for payment source

## 2020-11-26 ENCOUNTER — Ambulatory Visit (INDEPENDENT_AMBULATORY_CARE_PROVIDER_SITE_OTHER): Payer: No Typology Code available for payment source | Admitting: Internal Medicine

## 2020-11-26 ENCOUNTER — Other Ambulatory Visit: Payer: Self-pay

## 2020-11-26 VITALS — BP 165/97 | HR 82 | Temp 97.9°F | Ht 66.0 in | Wt 314.0 lb

## 2020-11-26 DIAGNOSIS — Z5181 Encounter for therapeutic drug level monitoring: Secondary | ICD-10-CM

## 2020-11-26 DIAGNOSIS — Z9189 Other specified personal risk factors, not elsewhere classified: Secondary | ICD-10-CM | POA: Diagnosis not present

## 2020-11-26 DIAGNOSIS — Z23 Encounter for immunization: Secondary | ICD-10-CM | POA: Diagnosis not present

## 2020-11-26 DIAGNOSIS — B20 Human immunodeficiency virus [HIV] disease: Secondary | ICD-10-CM | POA: Diagnosis not present

## 2020-11-26 DIAGNOSIS — Z6841 Body Mass Index (BMI) 40.0 and over, adult: Secondary | ICD-10-CM

## 2020-11-26 MED ORDER — PREZCOBIX 800-150 MG PO TABS: ORAL_TABLET | ORAL | 11 refills | Status: DC

## 2020-11-26 MED ORDER — BIKTARVY 50-200-25 MG PO TABS: 1.0000 | ORAL_TABLET | Freq: Every day | ORAL | 11 refills | Status: DC

## 2020-11-26 MED ORDER — SULFAMETHOXAZOLE-TRIMETHOPRIM 800-160 MG PO TABS: 1.0000 | ORAL_TABLET | Freq: Every day | ORAL | 11 refills | Status: DC

## 2020-11-26 NOTE — Progress Notes (Signed)
   Covid-19 Vaccination Clinic  Name:  Ariel Cox    MRN: [id number] DOB: 1970-08-05  11/26/2020  [name] was observed post Covid-19 immunization for 15 minutes without incident. She was provided with Vaccine Information Sheet and instruction to access the V-Safe system.   [name] was instructed to call 911 with any severe reactions post vaccine: Difficulty breathing  Swelling of face and throat  A fast heartbeat  A bad rash all over body  Dizziness and weakness     Sandie Ano, RN

## 2020-11-27 ENCOUNTER — Encounter: Payer: Self-pay | Admitting: Internal Medicine

## 2020-11-27 NOTE — Assessment & Plan Note (Signed)
She is doing well on her regimen and no changes indicated.  Unfortunately her CD4 count won't go up much despite remaining on the regimen and undetectable.   rtc in 6 months.

## 2020-11-27 NOTE — Assessment & Plan Note (Signed)
Some increase in her weight during the pandemic but now more motivated to increase her exercise and eat better.

## 2020-11-27 NOTE — Assessment & Plan Note (Signed)
Creat monitored and stable.

## 2020-11-27 NOTE — Assessment & Plan Note (Signed)
She will continue on Bactrim prophylaxis 

## 2020-11-27 NOTE — Progress Notes (Signed)
   Subjective:    Patient ID: Ariel Cox, female    DOB: 03-Nov-1970, 50 y.o.   MRN: [id number]  HPI Here for follow up of HIV She continues on Biktarvy + prezcobix for salavage regimen with a history of a K70E,V90I,A98G,V108I,E138G/K/R,Y181C,M184V and having no issues with her regimen.  Her CD4 has remained under 200 despite good compliance with an undetectable viral load.  She has no complaints today.  She recently had a PAP smear done by her PCP.    Review of Systems  Constitutional:  Negative for fatigue.  Gastrointestinal:  Negative for diarrhea and nausea.  Skin:  Negative for rash.      Objective:   Physical Exam Eyes:     General: No scleral icterus. Pulmonary:     Effort: Pulmonary effort is normal.  Neurological:     General: No focal deficit present.     Mental Status: She is alert.  Psychiatric:        Mood and Affect: Mood normal.   SH: no tobacco       Assessment & Plan:

## 2020-12-12 ENCOUNTER — Other Ambulatory Visit: Payer: Self-pay | Admitting: Internal Medicine

## 2020-12-12 DIAGNOSIS — B20 Human immunodeficiency virus [HIV] disease: Secondary | ICD-10-CM

## 2021-01-03 ENCOUNTER — Other Ambulatory Visit: Payer: Self-pay | Admitting: Internal Medicine

## 2021-01-03 DIAGNOSIS — B20 Human immunodeficiency virus [HIV] disease: Secondary | ICD-10-CM

## 2021-01-03 MED ORDER — BIKTARVY 50-200-25 MG PO TABS: 1.0000 | ORAL_TABLET | Freq: Every day | ORAL | 11 refills | Status: DC

## 2021-01-03 MED ORDER — SULFAMETHOXAZOLE-TRIMETHOPRIM 800-160 MG PO TABS: 1.0000 | ORAL_TABLET | Freq: Every day | ORAL | 11 refills | Status: DC

## 2021-02-03 IMAGING — RF DG ESOPHAGUS
1 series · 8 of 8 positions shown · non-contrast
Comparison: None.

CLINICAL DATA: Difficulty swallowing. Dysphagia

EXAM:
ESOPHOGRAM/BARIUM SWALLOW
TECHNIQUE: Combined double contrast and single contrast examination performed
using effervescent crystals, thick barium liquid, and thin barium
liquid.
FLUOROSCOPY TIME:  Fluoroscopy Time:  2 minutes 54 seconds
Radiation Exposure Index (if provided by the fluoroscopic device):
229 mGy
Number of Acquired Spot Images: 0

[Series 1: one shot · 0.14mm/px · 8 of 8 slices shown]
[im 1/8]
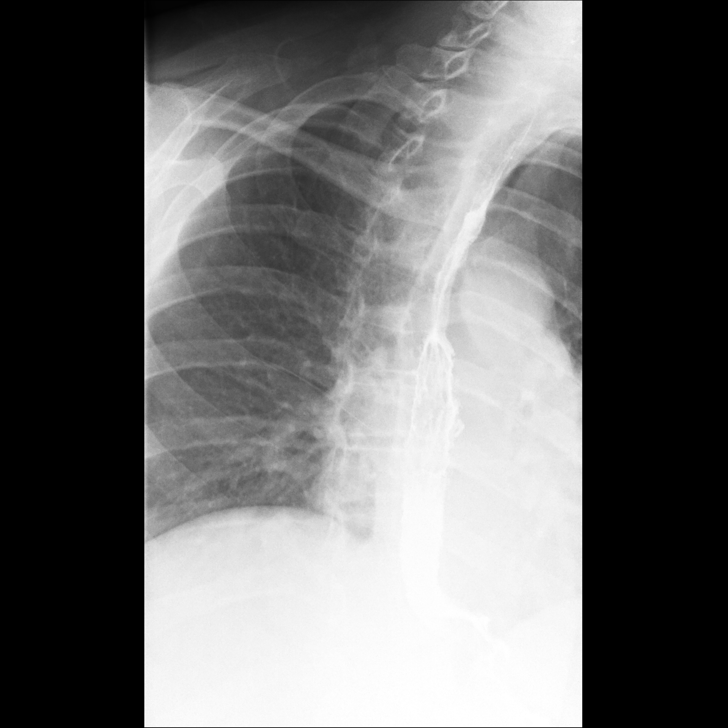
[im 2/8]
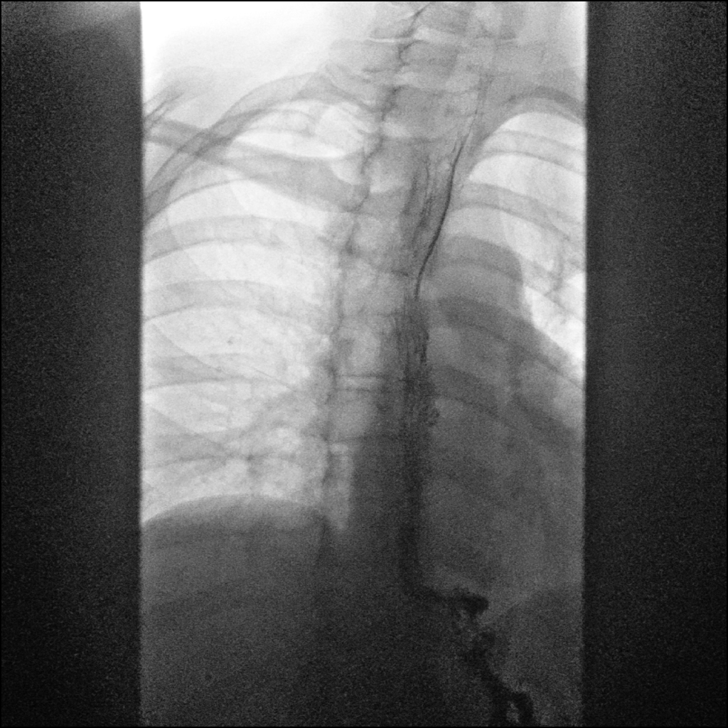
[im 3/8]
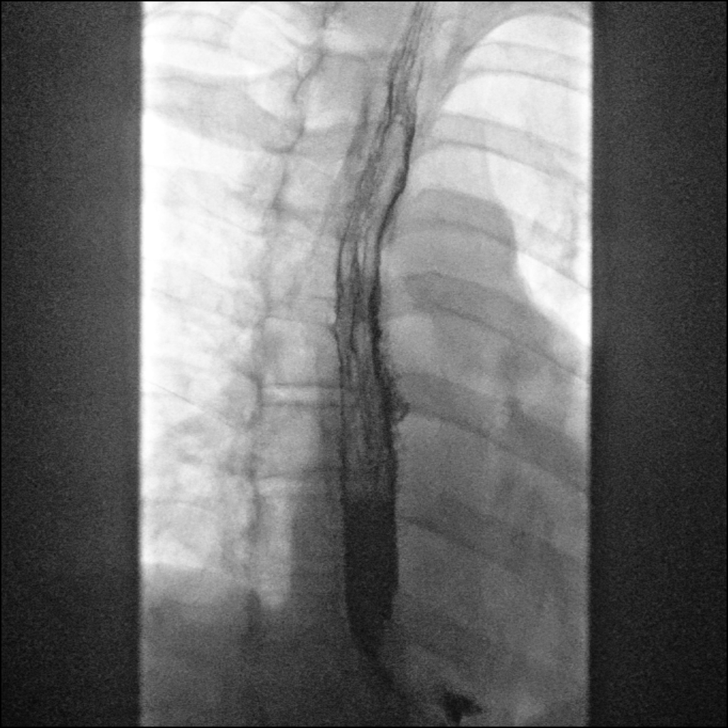
[im 4/8]
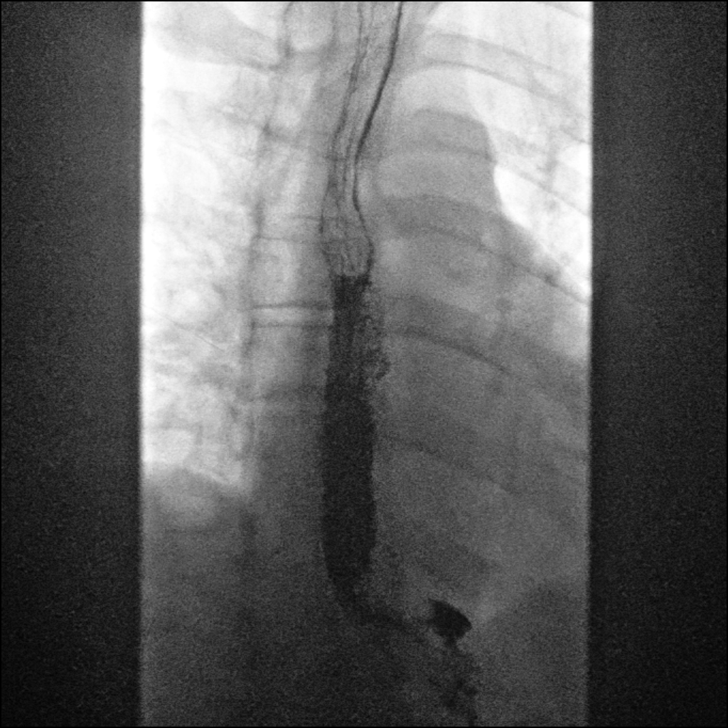
[im 5/8]
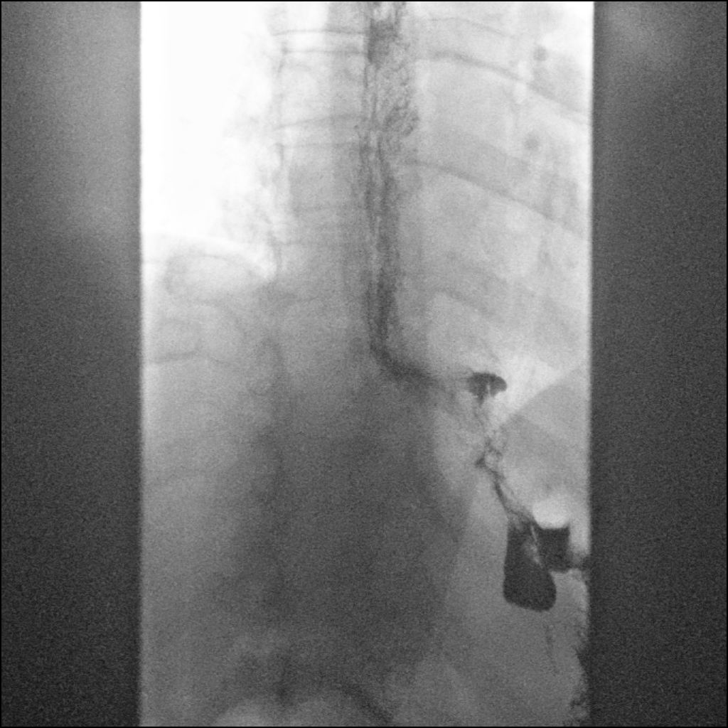
[im 6/8]
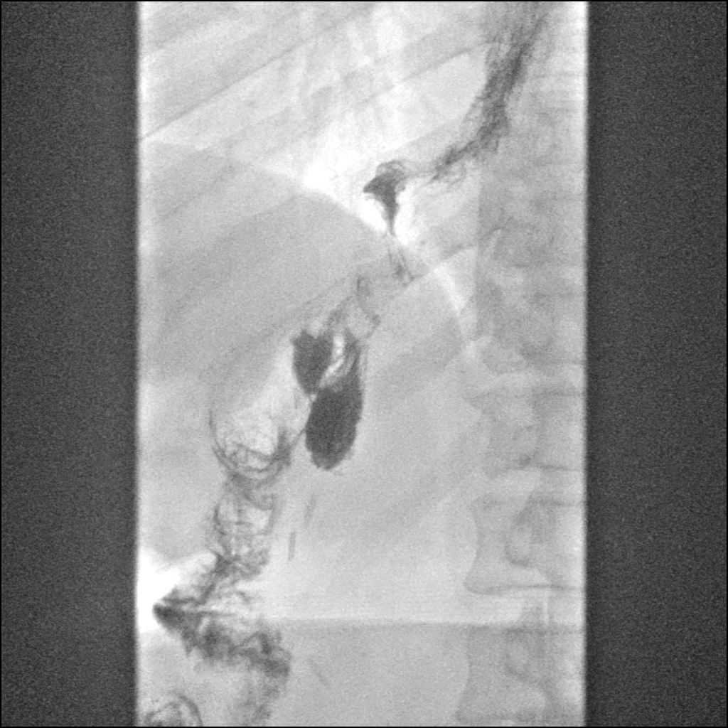
[im 7/8]
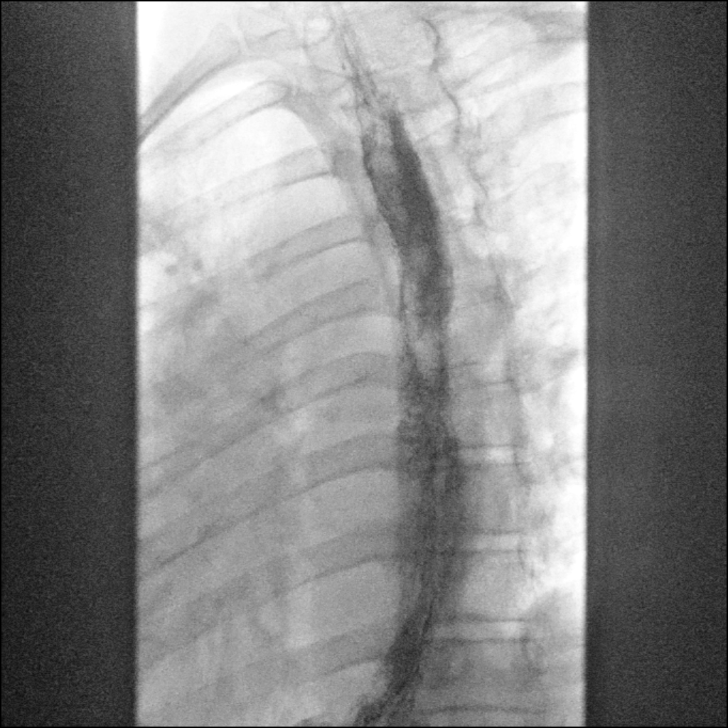
[im 8/8]
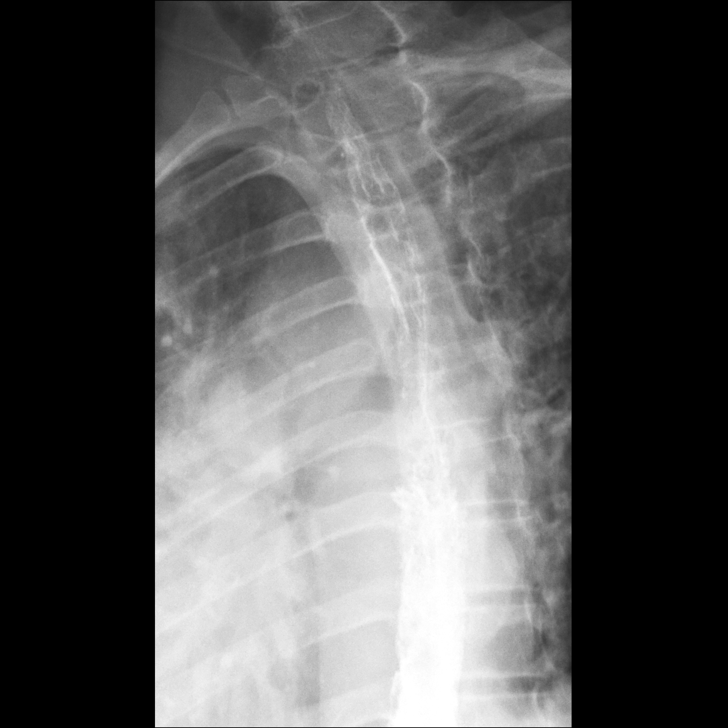

[8 of 8 positions shown; findings below may reference images not displayed]

FINDINGS: The esophagus is patent. There is no stricture or mass.

The esophageal mucosa is markedly abnormal with multiple irregular
longitudinally oriented mucosal plaques. The appearance of the
esophagus is consistent with a shaggy esophagus compatible with
candida esophagitis.

Postoperative anatomy of the upper GI tract is identified and
compatible with prior gastric bypass surgery.
IMPRESSION: 1. Markedly abnormal appearance of the esophageal mucosa compatible
with esophagitis. The appearance is most consistent with Candida
esophagitis in this patient who has known HIV. No high-grade
stricture or mass identified.

## 2021-11-29 ENCOUNTER — Other Ambulatory Visit: Payer: Self-pay | Admitting: Internal Medicine

## 2021-11-29 DIAGNOSIS — B20 Human immunodeficiency virus [HIV] disease: Secondary | ICD-10-CM

## 2021-11-29 MED ORDER — BIKTARVY 50-200-25 MG PO TABS: 1.0000 | ORAL_TABLET | Freq: Every day | ORAL | 0 refills | Status: DC

## 2021-11-29 MED ORDER — SULFAMETHOXAZOLE-TRIMETHOPRIM 800-160 MG PO TABS: 1.0000 | ORAL_TABLET | Freq: Every day | ORAL | 0 refills | Status: DC

## 2021-12-20 ENCOUNTER — Encounter: Payer: Self-pay | Admitting: Internal Medicine

## 2021-12-20 ENCOUNTER — Ambulatory Visit: Payer: No Typology Code available for payment source | Admitting: Internal Medicine

## 2021-12-20 ENCOUNTER — Other Ambulatory Visit: Payer: Self-pay

## 2021-12-20 VITALS — BP 137/89 | HR 81 | Resp 16 | Ht 66.0 in | Wt 266.0 lb

## 2021-12-20 DIAGNOSIS — Z5181 Encounter for therapeutic drug level monitoring: Secondary | ICD-10-CM

## 2021-12-20 DIAGNOSIS — N183 Chronic kidney disease, stage 3 unspecified: Secondary | ICD-10-CM | POA: Diagnosis not present

## 2021-12-20 DIAGNOSIS — B20 Human immunodeficiency virus [HIV] disease: Secondary | ICD-10-CM | POA: Diagnosis not present

## 2021-12-20 DIAGNOSIS — Z113 Encounter for screening for infections with a predominantly sexual mode of transmission: Secondary | ICD-10-CM

## 2021-12-20 DIAGNOSIS — Z79899 Other long term (current) drug therapy: Secondary | ICD-10-CM

## 2021-12-20 DIAGNOSIS — Z9189 Other specified personal risk factors, not elsewhere classified: Secondary | ICD-10-CM

## 2021-12-20 MED ORDER — PREZCOBIX 800-150 MG PO TABS: ORAL_TABLET | ORAL | 11 refills | Status: DC

## 2021-12-20 MED ORDER — SULFAMETHOXAZOLE-TRIMETHOPRIM 800-160 MG PO TABS: 1.0000 | ORAL_TABLET | Freq: Every day | ORAL | 11 refills | Status: DC

## 2021-12-20 MED ORDER — ATORVASTATIN CALCIUM 10 MG PO TABS: 10.0000 mg | ORAL_TABLET | Freq: Every day | ORAL | 0 refills | Status: AC

## 2021-12-20 MED ORDER — BIKTARVY 50-200-25 MG PO TABS: 1.0000 | ORAL_TABLET | Freq: Every day | ORAL | 11 refills | Status: DC

## 2021-12-20 NOTE — Progress Notes (Signed)
   Subjective:    Patient ID: Ariel Cox, female    DOB: 04-18-1970, 51 y.o.   MRN: [id number]  HPI Here for follow up of HIV She continues on Biktarvy and Prezcobix for HIV and denies any missed doses.  She is doing well and no concerns or issues getting or taking her medication.  She also continues with Bactrim with her persistently low CD4 count.  She was started on a statin by her PCP this year and follows a nephrologist now with some mild renal insufficiency.     Review of Systems  Constitutional:  Negative for fatigue.  Gastrointestinal:  Negative for diarrhea.  Skin:  Negative for rash.       Objective:   Physical Exam Eyes:     General: No scleral icterus. Skin:    Findings: No rash.  Neurological:     General: No focal deficit present.     Mental Status: She is alert.  Psychiatric:        Mood and Affect: Mood normal.   SH: no tobacco        Assessment & Plan:

## 2021-12-20 NOTE — Assessment & Plan Note (Signed)
Lipid panel monitored and managed by her PCP

## 2021-12-20 NOTE — Assessment & Plan Note (Signed)
She continues to have a low CD4 count but nicely suppressed virus so low risk.  She will though continue with Bactrim for OI prophylaxis though can consider stopping it next visit since she has remained suppressed.

## 2021-12-20 NOTE — Assessment & Plan Note (Signed)
She is now managed by nephrology

## 2021-12-20 NOTE — Assessment & Plan Note (Signed)
She continues to do well with her salvage regimen and no changes indicated.  Will check her labs today and if no issues, she can continue with yearly follow up.    With new research coming out by way of the [place] study regarding cardiovascular event risk reduction for PLWH, I discussed that over the 8 year trial initiation of statin (pitavastatin) therapy was shown to reduce CV disease by 35% independent of other risk factors.   The 10-year ASCVD risk score (Arnett DK, et al., 2019) is: 4.8%   Values used to calculate the score:     Age: 51 years     Sex: Female     Is Non-Hispanic African American: Yes     Diabetic: No     Tobacco smoker: No     Systolic Blood Pressure: 400 mmHg     Is BP treated: Yes     HDL Cholesterol: 58 mg/dL     Total Cholesterol: 200 mg/dL  We discussed the patient's 10-year CVD Risk Score to be at least moderately elevated, and would benefit from intervention given HIV+ and > 68 yo.   She is on atorvastatin now per her PCP and I discussed the above benefits.

## 2021-12-20 NOTE — Assessment & Plan Note (Signed)
Will check her LFTs 

## 2021-12-23 LAB — HEPATIC FUNCTION PANEL
AG Ratio: 1.3 (calc) (ref 1.0–2.5)
ALT: 17 U/L (ref 6–29)
AST: 20 U/L (ref 10–35)
Albumin: 3.8 g/dL (ref 3.6–5.1)
Alkaline phosphatase (APISO): 52 U/L (ref 37–153)
Bilirubin, Direct: 0.1 mg/dL (ref 0.0–0.2)
Globulin: 2.9 g/dL (calc) (ref 1.9–3.7)
Indirect Bilirubin: 0.3 mg/dL (calc) (ref 0.2–1.2)
Total Bilirubin: 0.4 mg/dL (ref 0.2–1.2)
Total Protein: 6.7 g/dL (ref 6.1–8.1)

## 2021-12-23 LAB — T-HELPER CELLS (CD4) COUNT (NOT AT ARMC)
Absolute CD4: 130 cells/uL — ABNORMAL LOW (ref 490–1740)
CD4 T Helper %: 16 % — ABNORMAL LOW (ref 30–61)
Total lymphocyte count: 828 cells/uL — ABNORMAL LOW (ref 850–3900)

## 2021-12-23 LAB — HIV-1 RNA QUANT-NO REFLEX-BLD
HIV 1 RNA Quant: NOT DETECTED Copies/mL
HIV-1 RNA Quant, Log: NOT DETECTED Log cps/mL

## 2021-12-23 LAB — RPR: RPR Ser Ql: NONREACTIVE

## 2021-12-24 ENCOUNTER — Telehealth: Payer: Self-pay

## 2021-12-24 NOTE — Telephone Encounter (Signed)
Margreta Journey, Pharmacy Tech with [facility] called office for medication clarification on Prezcoboix. Needs to confirm frequency on medication. Verbalized patient should be taking this times one with food. Do not crush.  Leatrice Jewels, RMA

## 2022-02-05 ENCOUNTER — Other Ambulatory Visit: Payer: Self-pay | Admitting: Internal Medicine

## 2022-02-05 DIAGNOSIS — B20 Human immunodeficiency virus [HIV] disease: Secondary | ICD-10-CM

## 2022-05-26 ENCOUNTER — Other Ambulatory Visit: Payer: Self-pay | Admitting: Internal Medicine

## 2022-05-26 DIAGNOSIS — B20 Human immunodeficiency virus [HIV] disease: Secondary | ICD-10-CM

## 2022-05-27 ENCOUNTER — Other Ambulatory Visit: Payer: Self-pay | Admitting: Internal Medicine

## 2022-05-27 DIAGNOSIS — B20 Human immunodeficiency virus [HIV] disease: Secondary | ICD-10-CM

## 2022-06-13 ENCOUNTER — Other Ambulatory Visit: Payer: Self-pay

## 2022-06-13 DIAGNOSIS — B20 Human immunodeficiency virus [HIV] disease: Secondary | ICD-10-CM

## 2022-06-13 MED ORDER — BIKTARVY 50-200-25 MG PO TABS: 1.0000 | ORAL_TABLET | Freq: Every day | ORAL | 1 refills | Status: DC

## 2022-11-12 ENCOUNTER — Other Ambulatory Visit: Payer: Self-pay | Admitting: Internal Medicine

## 2022-11-12 DIAGNOSIS — B20 Human immunodeficiency virus [HIV] disease: Secondary | ICD-10-CM

## 2022-12-03 ENCOUNTER — Other Ambulatory Visit: Payer: Self-pay

## 2022-12-03 ENCOUNTER — Other Ambulatory Visit: Payer: Self-pay | Admitting: Internal Medicine

## 2022-12-03 DIAGNOSIS — B20 Human immunodeficiency virus [HIV] disease: Secondary | ICD-10-CM

## 2022-12-03 DIAGNOSIS — Z79899 Other long term (current) drug therapy: Secondary | ICD-10-CM

## 2022-12-03 NOTE — Telephone Encounter (Signed)
90 day supply dispensed 8/8, appointment scheduled for 10/31.  Sandie Ano, RN

## 2022-12-04 ENCOUNTER — Other Ambulatory Visit: Payer: Self-pay

## 2022-12-04 ENCOUNTER — Other Ambulatory Visit: Payer: No Typology Code available for payment source

## 2022-12-04 DIAGNOSIS — Z79899 Other long term (current) drug therapy: Secondary | ICD-10-CM

## 2022-12-04 DIAGNOSIS — B20 Human immunodeficiency virus [HIV] disease: Secondary | ICD-10-CM

## 2022-12-05 LAB — T-HELPER CELL (CD4) - (RCID CLINIC ONLY)
CD4 % Helper T Cell: 14 % — ABNORMAL LOW (ref 33–65)
CD4 T Cell Abs: 215 /uL — ABNORMAL LOW (ref 400–1790)

## 2022-12-06 LAB — COMPLETE METABOLIC PANEL WITH GFR
AG Ratio: 1.3 (calc) (ref 1.0–2.5)
ALT: 21 U/L (ref 6–29)
AST: 23 U/L (ref 10–35)
Albumin: 3.9 g/dL (ref 3.6–5.1)
Alkaline phosphatase (APISO): 69 U/L (ref 37–153)
BUN/Creatinine Ratio: 12 (calc) (ref 6–22)
BUN: 17 mg/dL (ref 7–25)
CO2: 21 mmol/L (ref 20–32)
Calcium: 8.6 mg/dL (ref 8.6–10.4)
Chloride: 108 mmol/L (ref 98–110)
Creat: 1.42 mg/dL — ABNORMAL HIGH (ref 0.50–1.03)
Globulin: 2.9 g/dL (ref 1.9–3.7)
Glucose, Bld: 83 mg/dL (ref 65–99)
Potassium: 3.6 mmol/L (ref 3.5–5.3)
Sodium: 137 mmol/L (ref 135–146)
Total Bilirubin: 0.4 mg/dL (ref 0.2–1.2)
Total Protein: 6.8 g/dL (ref 6.1–8.1)
eGFR: 44 mL/min/{1.73_m2} — ABNORMAL LOW (ref >=60)

## 2022-12-06 LAB — CBC WITH DIFFERENTIAL/PLATELET
Absolute Lymphocytes: 1759 {cells}/uL (ref 850–3900)
Absolute Monocytes: 484 {cells}/uL (ref 200–950)
Basophils Absolute: 41 {cells}/uL (ref 0–200)
Basophils Relative: 1 %
Eosinophils Absolute: 123 {cells}/uL (ref 15–500)
Eosinophils Relative: 3 %
HCT: 34.6 % — ABNORMAL LOW (ref 35.0–45.0)
Hemoglobin: 11 g/dL — ABNORMAL LOW (ref 11.7–15.5)
MCH: 27.9 pg (ref 27.0–33.0)
MCHC: 31.8 g/dL — ABNORMAL LOW (ref 32.0–36.0)
MCV: 87.8 fL (ref 80.0–100.0)
MPV: 10 fL (ref 7.5–12.5)
Monocytes Relative: 11.8 %
Neutro Abs: 1693 {cells}/uL (ref 1500–7800)
Neutrophils Relative %: 41.3 %
Platelets: 295 10*3/uL (ref 140–400)
RBC: 3.94 10*6/uL (ref 3.80–5.10)
RDW: 13 % (ref 11.0–15.0)
Total Lymphocyte: 42.9 %
WBC: 4.1 10*3/uL (ref 3.8–10.8)

## 2022-12-06 LAB — LIPID PANEL
Cholesterol: 171 mg/dL (ref <200)
HDL: 66 mg/dL (ref >=50)
LDL Cholesterol (Calc): 91 mg/dL
Non-HDL Cholesterol (Calc): 105 mg/dL (ref <130)
Total CHOL/HDL Ratio: 2.6 (calc) (ref <5.0)
Triglycerides: 58 mg/dL (ref <150)

## 2022-12-06 LAB — HIV-1 RNA QUANT-NO REFLEX-BLD
HIV 1 RNA Quant: NOT DETECTED {copies}/mL
HIV-1 RNA Quant, Log: NOT DETECTED {Log}

## 2022-12-18 ENCOUNTER — Other Ambulatory Visit: Payer: Self-pay

## 2022-12-18 ENCOUNTER — Encounter: Payer: Self-pay | Admitting: Internal Medicine

## 2022-12-18 ENCOUNTER — Ambulatory Visit: Payer: No Typology Code available for payment source | Admitting: Internal Medicine

## 2022-12-18 VITALS — BP 120/84 | HR 73 | Temp 98.0°F | Wt 245.0 lb

## 2022-12-18 DIAGNOSIS — N183 Chronic kidney disease, stage 3 unspecified: Secondary | ICD-10-CM

## 2022-12-18 DIAGNOSIS — B20 Human immunodeficiency virus [HIV] disease: Secondary | ICD-10-CM | POA: Diagnosis not present

## 2022-12-18 DIAGNOSIS — Z79899 Other long term (current) drug therapy: Secondary | ICD-10-CM

## 2022-12-18 DIAGNOSIS — Z23 Encounter for immunization: Secondary | ICD-10-CM | POA: Insufficient documentation

## 2022-12-18 DIAGNOSIS — Z113 Encounter for screening for infections with a predominantly sexual mode of transmission: Secondary | ICD-10-CM

## 2022-12-18 MED ORDER — BIKTARVY 50-200-25 MG PO TABS: 1.0000 | ORAL_TABLET | Freq: Every day | ORAL | 3 refills | Status: DC

## 2022-12-18 MED ORDER — PREZCOBIX 800-150 MG PO TABS: ORAL_TABLET | ORAL | 3 refills | Status: DC

## 2022-12-18 NOTE — Addendum Note (Signed)
Addended by: Juanita Laster on: 12/18/2022 09:20 AM   Modules accepted: Orders

## 2022-12-18 NOTE — Assessment & Plan Note (Signed)
Discussed Prevnar-20 and given today °

## 2022-12-18 NOTE — Assessment & Plan Note (Signed)
She is doing well now on her salvage regimen with no missed doses or concerns.  She has remained suppressed and will continue with the same.  Labs reviewed with her.   Also discussed the Bactrim for PJP prophylaxis and at this point she has remained suppressed and CD4 of 215 so no absolute indication to continue and she will stop.  Follow up in 11 months

## 2022-12-18 NOTE — Progress Notes (Signed)
Subjective:    Patient ID: Ariel Cox, female    DOB: 1971/01/08, 52 y.o.   MRN: [id number]  HPI Drita is here for follow up of HIV She continues on Bitarvy and Prezcobix and denies any missed doses.  No issues with getting or taking her medication.  Feels well.   Follows with a PCP and nephrology.    Review of Systems  Constitutional:  Negative for fatigue.  Gastrointestinal:  Negative for diarrhea and nausea.  Skin:  Negative for rash.       Objective:   Physical Exam Eyes:     General: No scleral icterus. Pulmonary:     Effort: Pulmonary effort is normal.  Neurological:     Mental Status: She is alert.   SH: no tobacco        Assessment & Plan:

## 2022-12-18 NOTE — Assessment & Plan Note (Signed)
Screened negative 

## 2022-12-18 NOTE — Assessment & Plan Note (Signed)
Creat reviewed with  her and stable. Will continue to monitor.

## 2022-12-18 NOTE — Assessment & Plan Note (Signed)
Lipid panel reviewed with her and on a statin   Monitored by her PCP so will not recheck

## 2022-12-28 ENCOUNTER — Other Ambulatory Visit: Payer: Self-pay | Admitting: Internal Medicine

## 2023-07-01 NOTE — Progress Notes (Signed)
 The 10-year ASCVD risk score (Arnett DK, et al., 2019) is: 2.4%   Values used to calculate the score:     Age: 53 years     Sex: Female     Is Non-Hispanic African American: Yes     Diabetic: No     Tobacco smoker: No     Systolic Blood Pressure: 120 mmHg     Is BP treated: Yes     HDL Cholesterol: 66 mg/dL     Total Cholesterol: 171 mg/dL  Arlon Bergamo, BSN, RN

## 2023-07-28 ENCOUNTER — Other Ambulatory Visit: Payer: Self-pay | Admitting: Family Medicine

## 2023-07-28 DIAGNOSIS — Z1231 Encounter for screening mammogram for malignant neoplasm of breast: Secondary | ICD-10-CM

## 2023-07-31 ENCOUNTER — Ambulatory Visit
Admission: RE | Admit: 2023-07-31 | Discharge: 2023-07-31 | Disposition: A | Discharge status: Home / Self Care | Source: Ambulatory Visit | Attending: Family Medicine

## 2023-07-31 DIAGNOSIS — Z1231 Encounter for screening mammogram for malignant neoplasm of breast: Secondary | ICD-10-CM

## 2023-10-01 ENCOUNTER — Encounter (HOSPITAL_COMMUNITY): Payer: Self-pay

## 2023-10-01 ENCOUNTER — Emergency Department (HOSPITAL_COMMUNITY)
Admission: EM | Admit: 2023-10-01 | Discharge: 2023-10-02 | Disposition: A | Discharge status: Home / Self Care | Attending: Emergency Medicine | Admitting: Emergency Medicine

## 2023-10-01 DIAGNOSIS — I129 Hypertensive chronic kidney disease with stage 1 through stage 4 chronic kidney disease, or unspecified chronic kidney disease: Secondary | ICD-10-CM | POA: Insufficient documentation

## 2023-10-01 DIAGNOSIS — R1030 Lower abdominal pain, unspecified: Secondary | ICD-10-CM

## 2023-10-01 DIAGNOSIS — Z21 Asymptomatic human immunodeficiency virus [HIV] infection status: Secondary | ICD-10-CM | POA: Insufficient documentation

## 2023-10-01 DIAGNOSIS — N83202 Unspecified ovarian cyst, left side: Secondary | ICD-10-CM | POA: Diagnosis not present

## 2023-10-01 DIAGNOSIS — N189 Chronic kidney disease, unspecified: Secondary | ICD-10-CM | POA: Insufficient documentation

## 2023-10-01 DIAGNOSIS — Z79899 Other long term (current) drug therapy: Secondary | ICD-10-CM | POA: Diagnosis not present

## 2023-10-01 LAB — CBC WITH DIFFERENTIAL/PLATELET
Abs Immature Granulocytes: 0.01 K/uL (ref 0.00–0.07)
Basophils Absolute: 0 K/uL (ref 0.0–0.1)
Basophils Relative: 1 %
Eosinophils Absolute: 0.1 K/uL (ref 0.0–0.5)
Eosinophils Relative: 1 %
HCT: 33 % — ABNORMAL LOW (ref 36.0–46.0)
Hemoglobin: 10.6 g/dL — ABNORMAL LOW (ref 12.0–15.0)
Immature Granulocytes: 0 %
Lymphocytes Relative: 31 %
Lymphs Abs: 2 K/uL (ref 0.7–4.0)
MCH: 26.3 pg (ref 26.0–34.0)
MCHC: 32.1 g/dL (ref 30.0–36.0)
MCV: 81.9 fL (ref 80.0–100.0)
Monocytes Absolute: 0.9 K/uL (ref 0.1–1.0)
Monocytes Relative: 13 %
Neutro Abs: 3.5 K/uL (ref 1.7–7.7)
Neutrophils Relative %: 54 %
Platelets: 294 K/uL (ref 150–400)
RBC: 4.03 MIL/uL (ref 3.87–5.11)
RDW: 15.4 % (ref 11.5–15.5)
WBC: 6.4 K/uL (ref 4.0–10.5)
nRBC: 0 % (ref 0.0–0.2)

## 2023-10-01 LAB — COMPREHENSIVE METABOLIC PANEL WITH GFR
ALT: 29 U/L (ref 0–44)
AST: 39 U/L (ref 15–41)
Albumin: 3.3 g/dL — ABNORMAL LOW (ref 3.5–5.0)
Alkaline Phosphatase: 71 U/L (ref 38–126)
Anion gap: 8 (ref 5–15)
BUN: 23 mg/dL — ABNORMAL HIGH (ref 6–20)
CO2: 22 mmol/L (ref 22–32)
Calcium: 8 mg/dL — ABNORMAL LOW (ref 8.9–10.3)
Chloride: 107 mmol/L (ref 98–111)
Creatinine, Ser: 1.49 mg/dL — ABNORMAL HIGH (ref 0.44–1.00)
GFR, Estimated: 42 mL/min — ABNORMAL LOW (ref >60)
Glucose, Bld: 93 mg/dL (ref 70–99)
Potassium: 3.4 mmol/L — ABNORMAL LOW (ref 3.5–5.1)
Sodium: 137 mmol/L (ref 135–145)
Total Bilirubin: 0.3 mg/dL (ref 0.0–1.2)
Total Protein: 7.4 g/dL (ref 6.5–8.1)

## 2023-10-01 LAB — HCG, SERUM, QUALITATIVE: Preg, Serum: NEGATIVE

## 2023-10-01 LAB — LIPASE, BLOOD: Lipase: 50 U/L (ref 11–51)

## 2023-10-01 MED ORDER — ONDANSETRON HCL 4 MG/2ML IJ SOLN
4.0000 mg | Freq: Once | INTRAMUSCULAR | Status: AC
  Administered 2023-10-01: 4 mg via INTRAVENOUS
  Filled 2023-10-01: qty 2

## 2023-10-01 MED ORDER — MORPHINE SULFATE (PF) 4 MG/ML IV SOLN
4.0000 mg | Freq: Once | INTRAVENOUS | Status: AC
  Administered 2023-10-01: 4 mg via INTRAVENOUS
  Filled 2023-10-01: qty 1

## 2023-10-01 MED ORDER — SODIUM CHLORIDE 0.9 % IV BOLUS
1000.0000 mL | Freq: Once | INTRAVENOUS | Status: AC
  Administered 2023-10-01: 1000 mL via INTRAVENOUS

## 2023-10-01 NOTE — ED Provider Notes (Signed)
 [place] EMERGENCY DEPARTMENT AT [facility] Provider Note   CSN: [id number] Arrival date & time: 10/01/23  2234     Patient presents with: Abdominal Pain   Ariel Cox is a 53 y.o. female.   The history is provided by the patient and medical records.  Abdominal Pain Ariel Cox is a 53 y.o. female who presents to the Emergency Department complaining of abdominal pain.  She presents to the emergency department for evaluation of intense, cramping lower abdominal pain.  The symptoms started several hours ago and was initially mild.  Overall symptoms are slightly improved.  She has associated diaphoresis, nausea, diarrhea.  No fever but she has had chills.  No dysuria, vaginal discharge.  She is perimenopausal, last cycle was about a month ago.  She is sexually active with no new partners.  She has a history of CKD, well-controlled HIV.  She has a previous history of hypertension but is no longer on medications.  She is status post gastric bypass about 20 years ago.  She did have similar symptoms in the past due to PID about 10 years ago.     Prior to Admission medications   Medication Sig Start Date End Date Taking? Authorizing Provider  HYDROcodone -acetaminophen  (NORCO/VICODIN) 5-325 MG tablet Take 1 tablet by mouth every 6 (six) hours as needed. 10/02/23  Yes Griselda Norris, MD  ondansetron  (ZOFRAN -ODT) 4 MG disintegrating tablet Take 1 tablet (4 mg total) by mouth every 8 (eight) hours as needed. 10/02/23  Yes Griselda Norris, MD  acetaminophen  (TYLENOL ) 500 MG tablet Take 500 mg by mouth in the morning, at noon, and at bedtime.    [provider]  atorvastatin  (LIPITOR) 10 MG tablet Take 1 tablet (10 mg total) by mouth daily. 12/20/21   Efrain Lamar ORN, MD  bictegravir-emtricitabine-tenofovir AF (BIKTARVY ) 50-200-25 MG TABS tablet Take 1 tablet by mouth daily. 12/18/22   Comer, Lamar ORN, MD  darunavir -cobicistat  (PREZCOBIX ) 800-150 MG tablet TAKE 1  TABLET BY MOUTH 1 TIME A DAY. SWALLOW WHOLE. DO NOT CRUSH, BREAK OR CHEW TABLETS. TAKE WITH FOOD. 12/18/22   Efrain Lamar ORN, MD  Ferrous Sulfate (IRON) 325 (65 Fe) MG TABS 1 tablet Orally Once a day    [provider]  omeprazole  (PRILOSEC) 20 MG capsule Take 20 mg by mouth daily. 06/03/19   [provider]  phentermine (ADIPEX-P) 37.5 MG tablet Take 37.5 mg by mouth every morning.    [provider]  valsartan-hydrochlorothiazide (DIOVAN-HCT) 160-12.5 MG tablet Take 1 tablet by mouth daily. 11/16/20   [provider]  WEGOVY 2.4 MG/0.75ML SOAJ inject 2.4 mg Subcutaneous once a week for 30 days    [provider]    Allergies: Shellfish allergy    Review of Systems  Gastrointestinal:  Positive for abdominal pain.  All other systems reviewed and are negative.   Updated Vital Signs BP (!) 121/98   Pulse 67   Temp 97.6 F (36.4 C) (Oral)   Resp 12   Ht 5' 6 (1.676 m)   Wt 108.4 kg   SpO2 100%   BMI 38.58 kg/m   Physical Exam Vitals and nursing note reviewed.  Constitutional:      Appearance: She is well-developed.  HENT:     Head: Normocephalic and atraumatic.  Cardiovascular:     Rate and Rhythm: Normal rate and regular rhythm.  Pulmonary:     Effort: Pulmonary effort is normal. No respiratory distress.  Abdominal:  Palpations: Abdomen is soft.     Tenderness: There is no rebound.     Comments: Mild lower abdominal tenderness  Genitourinary:    Comments: Scant white vaginal discharge.  No CMT. Musculoskeletal:        General: No tenderness.  Skin:    General: Skin is warm and dry.  Neurological:     Mental Status: She is alert and oriented to person, place, and time.  Psychiatric:        Behavior: Behavior normal.     (all labs ordered are listed, but only abnormal results are displayed) Labs Reviewed  COMPREHENSIVE METABOLIC PANEL WITH GFR - Abnormal; Notable for the following components:      Result Value    Potassium 3.4 (*)    BUN 23 (*)    Creatinine, Ser 1.49 (*)    Calcium  8.0 (*)    Albumin 3.3 (*)    GFR, Estimated 42 (*)    All other components within normal limits  CBC WITH DIFFERENTIAL/PLATELET - Abnormal; Notable for the following components:   Hemoglobin 10.6 (*)    HCT 33.0 (*)    All other components within normal limits  URINALYSIS, ROUTINE W REFLEX MICROSCOPIC - Abnormal; Notable for the following components:   Color, Urine STRAW (*)    Leukocytes,Ua SMALL (*)    Bacteria, UA FEW (*)    All other components within normal limits  WET PREP, GENITAL  LIPASE, BLOOD  HCG, SERUM, QUALITATIVE    EKG: None  Radiology: CT ABDOMEN PELVIS W CONTRAST Result Date: 10/02/2023 CLINICAL DATA:  Acute abdominal pain EXAM: CT ABDOMEN AND PELVIS WITH CONTRAST TECHNIQUE: Multidetector CT imaging of the abdomen and pelvis was performed using the standard protocol following bolus administration of intravenous contrast. RADIATION DOSE REDUCTION: This exam was performed according to the departmental dose-optimization program which includes automated exposure control, adjustment of the mA and/or kV according to patient size and/or use of iterative reconstruction technique. CONTRAST:  80mL OMNIPAQUE  IOHEXOL  300 MG/ML  SOLN COMPARISON:  None Available. FINDINGS: Lower chest: No acute abnormality. Hepatobiliary: No focal liver abnormality is seen. No gallstones, gallbladder wall thickening, or biliary dilatation. Pancreas: Unremarkable. No pancreatic ductal dilatation or surrounding inflammatory changes. Spleen: Normal in size without focal abnormality. Adrenals/Urinary Tract: Adrenal glands are within normal limits. Kidneys demonstrate a normal enhancement pattern bilaterally. No calculi or obstructive changes are seen. The bladder is partially distended. Stomach/Bowel: No obstructive or inflammatory changes of the colon are seen. The appendix is within normal limits. Small bowel and stomach show no acute  abnormality. Changes of prior gastric bypass are seen. Vascular/Lymphatic: Aortic atherosclerosis. No enlarged abdominal or pelvic lymph nodes. Reproductive: Uterus is within normal limits. A 3.4 cm cystic lesion is noted left adnexa. Other: No abdominal wall hernia or abnormality. No abdominopelvic ascites. Musculoskeletal: No acute or significant osseous findings. IMPRESSION: Left adnexal simple-appearing cyst measuring 3.4 cm. Recommend follow-up pelvic ultrasound in 6-12 months. Reference: JACR 2020 Feb;17(2):248-254 Electronically Signed   By: Oneil Devonshire M.D.   On: 10/02/2023 02:10     Procedures   Medications Ordered in the ED  sodium chloride  0.9 % bolus 1,000 mL (0 mLs Intravenous Stopped 10/02/23 0225)  morphine  (PF) 4 MG/ML injection 4 mg (4 mg Intravenous Given 10/01/23 2330)  ondansetron  (ZOFRAN ) injection 4 mg (4 mg Intravenous Given 10/01/23 2331)  iohexol  (OMNIPAQUE ) 300 MG/ML solution 80 mL (80 mLs Intravenous Contrast Given 10/02/23 0121)  Medical Decision Making Amount and/or Complexity of Data Reviewed Labs: ordered. Radiology: ordered.  Risk Prescription drug management.   Patient here for evaluation of lower abdominal pain, nausea. Patient with tenderness on examination without peritoneal findings. Pelvic examination with scant discharge, no CMT. Current picture is not consistent with PID. Labs are baseline with stable renal insufficiency. UA is not consistent with UTI. CTM pelvis was obtained, which does not show a definite source of her symptoms. It does note a simple appearing left at naxal cyst. Current picture is not consistent with torsion, to or an abscess. No evidence of diverticulitis or appendicitis. She is feeling improved on repeat assessment. Discussed with patient findings and studies. Feels she is stable for discharge home with outpatient follow-up and return precautions.     Final diagnoses:  Lower abdominal pain   Cyst of left ovary    ED Discharge Orders          Ordered    HYDROcodone -acetaminophen  (NORCO/VICODIN) 5-325 MG tablet  Every 6 hours PRN        10/02/23 0224    ondansetron  (ZOFRAN -ODT) 4 MG disintegrating tablet  Every 8 hours PRN        10/02/23 0224               Griselda Norris, MD 10/02/23 [phone]

## 2023-10-01 NOTE — ED Triage Notes (Signed)
 Pt BIB ems for abdominal pain started today along with cramping, nausea and vomiting. States No blood in the stool. States its similar to her pelvic inflammatory disease when she first got it. A&O x4, VS stable

## 2023-10-02 ENCOUNTER — Emergency Department (HOSPITAL_COMMUNITY)

## 2023-10-02 ENCOUNTER — Encounter (HOSPITAL_COMMUNITY): Payer: Self-pay

## 2023-10-02 LAB — URINALYSIS, ROUTINE W REFLEX MICROSCOPIC
Bilirubin Urine: NEGATIVE
Glucose, UA: NEGATIVE mg/dL
Hgb urine dipstick: NEGATIVE
Ketones, ur: NEGATIVE mg/dL
Nitrite: NEGATIVE
Protein, ur: NEGATIVE mg/dL
Specific Gravity, Urine: 1.005 (ref 1.005–1.030)
pH: 6 (ref 5.0–8.0)

## 2023-10-02 LAB — WET PREP, GENITAL
Clue Cells Wet Prep HPF POC: NONE SEEN
Sperm: NONE SEEN
Trich, Wet Prep: NONE SEEN
WBC, Wet Prep HPF POC: <10 (ref <10)
Yeast Wet Prep HPF POC: NONE SEEN

## 2023-10-02 MED ORDER — IOHEXOL 300 MG/ML  SOLN
80.0000 mL | Freq: Once | INTRAMUSCULAR | Status: AC | PRN
  Administered 2023-10-02: 80 mL via INTRAVENOUS

## 2023-10-02 MED ORDER — ONDANSETRON 4 MG PO TBDP: 4.0000 mg | ORAL_TABLET | Freq: Three times a day (TID) | ORAL | 0 refills | Status: AC | PRN

## 2023-10-02 MED ORDER — HYDROCODONE-ACETAMINOPHEN 5-325 MG PO TABS: 1.0000 | ORAL_TABLET | Freq: Four times a day (QID) | ORAL | 0 refills | Status: AC | PRN

## 2023-10-02 NOTE — ED Notes (Signed)
 Pelvic cart brought to bedside. Pelvic exam setup at bedside table

## 2023-10-02 NOTE — Discharge Instructions (Addendum)
 You had a CT scan of your abdomen today that showed a simple 3.4cm cyst on your left ovary that should be rechecked by ultrasound in the next 6-12 months.

## 2023-11-03 ENCOUNTER — Other Ambulatory Visit: Payer: Self-pay

## 2023-11-03 DIAGNOSIS — B20 Human immunodeficiency virus [HIV] disease: Secondary | ICD-10-CM

## 2023-11-10 ENCOUNTER — Other Ambulatory Visit: Payer: No Typology Code available for payment source

## 2023-11-10 ENCOUNTER — Other Ambulatory Visit (HOSPITAL_COMMUNITY)
Admission: RE | Admit: 2023-11-10 | Discharge: 2023-11-10 | Disposition: A | Discharge status: Home / Self Care | Source: Ambulatory Visit | Attending: Internal Medicine | Admitting: Internal Medicine

## 2023-11-10 ENCOUNTER — Other Ambulatory Visit: Payer: Self-pay

## 2023-11-10 DIAGNOSIS — B20 Human immunodeficiency virus [HIV] disease: Secondary | ICD-10-CM

## 2023-11-10 LAB — URINE CYTOLOGY ANCILLARY ONLY
Chlamydia: NEGATIVE
Comment: NEGATIVE
Comment: NORMAL
Neisseria Gonorrhea: NEGATIVE

## 2023-11-10 LAB — T-HELPER CELL (CD4) - (RCID CLINIC ONLY)
CD4 % Helper T Cell: 16 % — ABNORMAL LOW (ref 33–65)
CD4 T Cell Abs: 275 /uL — ABNORMAL LOW (ref 400–1790)

## 2023-11-12 LAB — COMPLETE METABOLIC PANEL WITHOUT GFR
AG Ratio: 1.7 (calc) (ref 1.0–2.5)
ALT: 18 U/L (ref 6–29)
AST: 23 U/L (ref 10–35)
Albumin: 3.9 g/dL (ref 3.6–5.1)
Alkaline phosphatase (APISO): 75 U/L (ref 37–153)
BUN/Creatinine Ratio: 12 (calc) (ref 6–22)
BUN: 17 mg/dL (ref 7–25)
CO2: 26 mmol/L (ref 20–32)
Calcium: 7.9 mg/dL — ABNORMAL LOW (ref 8.6–10.4)
Chloride: 105 mmol/L (ref 98–110)
Creat: 1.41 mg/dL — ABNORMAL HIGH (ref 0.50–1.03)
Globulin: 2.3 g/dL (ref 1.9–3.7)
Glucose, Bld: 79 mg/dL (ref 65–99)
Potassium: 3.6 mmol/L (ref 3.5–5.3)
Sodium: 138 mmol/L (ref 135–146)
Total Bilirubin: 0.3 mg/dL (ref 0.2–1.2)
Total Protein: 6.2 g/dL (ref 6.1–8.1)

## 2023-11-12 LAB — CBC WITH DIFFERENTIAL/PLATELET
Absolute Lymphocytes: 1877 {cells}/uL (ref 850–3900)
Absolute Monocytes: 540 {cells}/uL (ref 200–950)
Basophils Absolute: 41 {cells}/uL (ref 0–200)
Basophils Relative: 0.9 %
Eosinophils Absolute: 171 {cells}/uL (ref 15–500)
Eosinophils Relative: 3.8 %
HCT: 33.3 % — ABNORMAL LOW (ref 35.0–45.0)
Hemoglobin: 10.3 g/dL — ABNORMAL LOW (ref 11.7–15.5)
MCH: 26.8 pg — ABNORMAL LOW (ref 27.0–33.0)
MCHC: 30.9 g/dL — ABNORMAL LOW (ref 32.0–36.0)
MCV: 86.7 fL (ref 80.0–100.0)
MPV: 10.1 fL (ref 7.5–12.5)
Monocytes Relative: 12 %
Neutro Abs: 1872 {cells}/uL (ref 1500–7800)
Neutrophils Relative %: 41.6 %
Platelets: 253 Thousand/uL (ref 140–400)
RBC: 3.84 Million/uL (ref 3.80–5.10)
RDW: 13.1 % (ref 11.0–15.0)
Total Lymphocyte: 41.7 %
WBC: 4.5 Thousand/uL (ref 3.8–10.8)

## 2023-11-12 LAB — LIPID PANEL
Cholesterol: 167 mg/dL (ref <200)
HDL: 56 mg/dL (ref >=50)
LDL Cholesterol (Calc): 97 mg/dL
Non-HDL Cholesterol (Calc): 111 mg/dL (ref <130)
Total CHOL/HDL Ratio: 3 (calc) (ref <5.0)
Triglycerides: 57 mg/dL (ref <150)

## 2023-11-12 LAB — RPR: RPR Ser Ql: NONREACTIVE

## 2023-11-12 LAB — HIV-1 RNA QUANT-NO REFLEX-BLD
HIV 1 RNA Quant: NOT DETECTED {copies}/mL
HIV-1 RNA Quant, Log: NOT DETECTED {Log_copies}/mL

## 2023-11-23 ENCOUNTER — Other Ambulatory Visit: Payer: Self-pay

## 2023-11-23 ENCOUNTER — Ambulatory Visit: Admitting: Internal Medicine

## 2023-11-23 ENCOUNTER — Encounter: Payer: Self-pay | Admitting: Internal Medicine

## 2023-11-23 VITALS — BP 138/89 | HR 77 | Temp 98.3°F | Ht 66.0 in | Wt 245.0 lb

## 2023-11-23 DIAGNOSIS — Z79899 Other long term (current) drug therapy: Secondary | ICD-10-CM | POA: Diagnosis not present

## 2023-11-23 DIAGNOSIS — Z23 Encounter for immunization: Secondary | ICD-10-CM

## 2023-11-23 DIAGNOSIS — B20 Human immunodeficiency virus [HIV] disease: Secondary | ICD-10-CM | POA: Diagnosis not present

## 2023-11-23 DIAGNOSIS — Z7185 Encounter for immunization safety counseling: Secondary | ICD-10-CM

## 2023-11-23 MED ORDER — PREZCOBIX 800-150 MG PO TABS: ORAL_TABLET | ORAL | 4 refills | Status: AC

## 2023-11-23 MED ORDER — BIKTARVY 50-200-25 MG PO TABS: 1.0000 | ORAL_TABLET | Freq: Every day | ORAL | 4 refills | Status: AC

## 2023-11-23 NOTE — Progress Notes (Deleted)
 [facility]     HPI: Ariel Cox is a 53 y.o. female presents for HIV management. 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  Anal sex receptive***, insertive***. Contraception**** Oral sex, contraception*** Vaginal penile sex, contraception***  Social: Occupation: Housing: Support: Understanding of HIV: Etoh/drug/tobacco use:  Past Medical History:  Diagnosis Date   Depression    Esophageal stricture    GERD (gastroesophageal reflux disease)    HIV infection (HCC)    Hypertension     Past Surgical History:  Procedure Laterality Date   GASTRIC BYPASS  2009    Family History  Problem Relation Age of Onset   Breast cancer Maternal Aunt    Hypertension Father    Diabetes Maternal Grandmother    Cancer Paternal Grandmother        Pancreatic   Cancer Maternal Grandfather        lung   Current Outpatient Medications on File Prior to Visit  Medication Sig Dispense Refill   acetaminophen  (TYLENOL ) 500 MG tablet Take 500 mg by mouth in the morning, at noon, and at bedtime.     atorvastatin  (LIPITOR) 10 MG tablet Take 1 tablet (10 mg total) by mouth daily. 30 tablet 0   bictegravir-emtricitabine-tenofovir AF (BIKTARVY ) 50-200-25 MG TABS tablet Take 1 tablet by mouth daily. 90 tablet 3   darunavir -cobicistat  (PREZCOBIX ) 800-150 MG tablet TAKE 1 TABLET BY MOUTH 1 TIME A DAY. SWALLOW WHOLE. DO NOT CRUSH, BREAK OR CHEW TABLETS. TAKE WITH FOOD. 90 tablet 3   Ferrous Sulfate (IRON) 325 (65 Fe) MG TABS 1 tablet Orally Once a day     HYDROcodone -acetaminophen  (NORCO/VICODIN) 5-325 MG tablet Take 1 tablet by mouth every 6 (six) hours as needed. 10 tablet 0   omeprazole  (PRILOSEC) 20 MG capsule Take 20 mg by mouth daily.     ondansetron  (ZOFRAN -ODT) 4 MG disintegrating tablet Take 1 tablet (4 mg total) by mouth every  8 (eight) hours as needed. 12 tablet 0   phentermine (ADIPEX-P) 37.5 MG tablet Take 37.5 mg by mouth every morning.     valsartan-hydrochlorothiazide (DIOVAN-HCT) 160-12.5 MG tablet Take 1 tablet by mouth daily.     WEGOVY 2.4 MG/0.75ML SOAJ inject 2.4 mg Subcutaneous once a week for 30 days     No current facility-administered medications on file prior to visit.    Allergies  Allergen Reactions   Shellfish Allergy Rash      Lab Results HIV 1 RNA Quant  Date Value  11/10/2023 NOT DETECTED copies/mL  12/04/2022 Not Detected Copies/mL  12/20/2021 Not Detected Copies/mL   CD4 T Cell Abs (/uL)  Date Value  11/10/2023 275 (L)  12/04/2022 215 (L)  07/02/2020 156 (L)   No results found for: HIV1GENOSEQ Lab Results  Component Value Date   WBC 4.5 11/10/2023   HGB 10.3 (L) 11/10/2023   HCT 33.3 (L) 11/10/2023   MCV 86.7 11/10/2023   PLT 253 11/10/2023    Lab Results  Component Value Date   CREATININE 1.41 (H) 11/10/2023   BUN 17 11/10/2023   NA 138 11/10/2023   K 3.6 11/10/2023   CL 105 11/10/2023   CO2 26 11/10/2023   Lab Results  Component Value Date   ALT 18 11/10/2023   AST 23 11/10/2023   ALKPHOS 71 10/01/2023   BILITOT 0.3 11/10/2023  Lab Results  Component Value Date   CHOL 167 11/10/2023   TRIG 57 11/10/2023   HDL 56 11/10/2023   LDLCALC 97 11/10/2023   No results found for: HAV Lab Results  Component Value Date   HEPBSAG No 04/13/2006   HEPBSAB No 04/13/2006   Lab Results  Component Value Date   HCVAB no 04/13/2006   Lab Results  Component Value Date   CHLAMYDIAWP Negative 11/10/2023   N Negative 11/10/2023   No results found for: GCPROBEAPT No results found for: QUANTGOLD  Assessment/Plan #HIV/Asymtomatic -CD4 275, VL nd, on 11/10/23 -Continue biktarvy  and prezcobix  -F/U in 11 months    #Vaccination COVID today Flu today Monkeypox PCV 20 in 2024 Meningitis 2018, needs 2nd odse HepA-2008 non-immune->neesds  vax HEpB-2008 non-immune 3 dose series in 2003 Tdap-2023 Shingles  #Health maintenance -Quantiferon -RPR-nr 11/10/23 -HCV -GC urine negative 11/10/23 -Lipid on static, sees pcp -Dysplasia screen F -Mammogram - 07/31/23 -Colonoscopy    Ariel Palazzi Dennise, MD [facility]

## 2023-11-23 NOTE — Progress Notes (Signed)
 [facility]     HPI: Ariel Cox is a 53 y.o. female presents for HIV management. He is on biktarvy  and prezcobic. No missed doses.  Sexually acitve since last visit with one partner.    Social: Occupation: Presenter, broadcasting Housing: house withson Support: yes Understanding of HIV: Etoh/drug/tobacco use: socail/no/no  Past Medical History:  Diagnosis Date   Depression    Esophageal stricture    GERD (gastroesophageal reflux disease)    HIV infection (HCC)    Hypertension     Past Surgical History:  Procedure Laterality Date   GASTRIC BYPASS  2009    Family History  Problem Relation Age of Onset   Breast cancer Maternal Aunt    Hypertension Father    Diabetes Maternal Grandmother    Cancer Paternal Grandmother        Pancreatic   Cancer Maternal Grandfather        lung   Current Outpatient Medications on File Prior to Visit  Medication Sig Dispense Refill   acetaminophen  (TYLENOL ) 500 MG tablet Take 500 mg by mouth in the morning, at noon, and at bedtime.     atorvastatin  (LIPITOR) 10 MG tablet Take 1 tablet (10 mg total) by mouth daily. 30 tablet 0   bictegravir-emtricitabine-tenofovir AF (BIKTARVY ) 50-200-25 MG TABS tablet Take 1 tablet by mouth daily. 90 tablet 3   darunavir -cobicistat  (PREZCOBIX ) 800-150 MG tablet TAKE 1 TABLET BY MOUTH 1 TIME A DAY. SWALLOW WHOLE. DO NOT CRUSH, BREAK OR CHEW TABLETS. TAKE WITH FOOD. 90 tablet 3   Ferrous Sulfate (IRON) 325 (65 Fe) MG TABS 1 tablet Orally Once a day     omeprazole  (PRILOSEC) 20 MG capsule Take 20 mg by mouth daily.     ondansetron  (ZOFRAN -ODT) 4 MG disintegrating tablet Take 1 tablet (4 mg total) by mouth every 8 (eight) hours as needed. 12 tablet 0   phentermine (ADIPEX-P) 37.5 MG tablet Take 37.5 mg by mouth every morning.     valsartan-hydrochlorothiazide (DIOVAN-HCT) 160-12.5 MG tablet Take 1 tablet by mouth daily.     WEGOVY 2.4 MG/0.75ML SOAJ inject 2.4 mg Subcutaneous  once a week for 30 days     B Complex Vitamins (VITAMIN B COMPLEX) TABS 1 capsule.     HYDROcodone -acetaminophen  (NORCO/VICODIN) 5-325 MG tablet Take 1 tablet by mouth every 6 (six) hours as needed. (Patient not taking: Reported on 11/23/2023) 10 tablet 0   No current facility-administered medications on file prior to visit.    Allergies  Allergen Reactions   Lisinopril Cough   Shellfish Allergy Rash      Lab Results HIV 1 RNA Quant  Date Value  11/10/2023 NOT DETECTED copies/mL  12/04/2022 Not Detected Copies/mL  12/20/2021 Not Detected Copies/mL   CD4 T Cell Abs (/uL)  Date Value  11/10/2023 275 (L)  12/04/2022 215 (L)  07/02/2020 156 (L)   No results found for: HIV1GENOSEQ Lab Results  Component Value Date   WBC 4.5 11/10/2023   HGB 10.3 (L) 11/10/2023   HCT 33.3 (L) 11/10/2023   MCV 86.7 11/10/2023   PLT 253 11/10/2023    Lab Results  Component Value Date   CREATININE 1.41 (H) 11/10/2023   BUN 17 11/10/2023   NA 138 11/10/2023   K 3.6 11/10/2023   CL 105 11/10/2023   CO2 26 11/10/2023   Lab Results  Component Value Date   ALT 18 11/10/2023   AST 23 11/10/2023   ALKPHOS 71  10/01/2023   BILITOT 0.3 11/10/2023    Lab Results  Component Value Date   CHOL 167 11/10/2023   TRIG 57 11/10/2023   HDL 56 11/10/2023   LDLCALC 97 11/10/2023   No results found for: HAV Lab Results  Component Value Date   HEPBSAG No 04/13/2006   HEPBSAB No 04/13/2006   Lab Results  Component Value Date   HCVAB no 04/13/2006   Lab Results  Component Value Date   CHLAMYDIAWP Negative 11/10/2023   N Negative 11/10/2023   No results found for: GCPROBEAPT No results found for: QUANTGOLD  Assessment/Plan #HIV/Asymtomatic -CD4 275, VL nd, on 11/10/23 -Continue biktarvy  and prezcobix  -F/U in 11 months    #Vaccination COVID today->yes Flu today-> yes Monkeypox PCV 20 in 2024 Meningitis 2018, needs 2nd odse HepA-2008 non-immune->neesds vax HEpB-2008  non-immune 3 dose series in 2003 Tdap-2023 Shingles  #Health maintenance -Quantiferon -RPR-nr 11/10/23 -HCV -GC urine negative 11/10/23 -Lipid on static, sees pcp -Dysplasia screen F-PCP -Mammogram - 07/31/23 -Colonoscopy-4 years ago    Loney Stank, MD [facility] I have personally spent 41 minutes involved in face-to-face and non-face-to-face activities for this patient on the day of the visit. Professional time spent includes the following activities: Preparing to see the patient (review of tests), Obtaining and/or reviewing separately obtained history (admission/discharge record), Performing a medically appropriate examination and/or evaluation , Ordering medications/tests/procedures, referring and communicating with other health care professionals, Documenting clinical information in the EMR, Independently interpreting results (not separately reported), Communicating results to the patient/family/caregiver, Counseling and educating the patient/family/caregiver and Care coordination (not separately reported).

## 2023-11-24 ENCOUNTER — Ambulatory Visit: Payer: Self-pay | Admitting: Internal Medicine

## 2024-03-16 ENCOUNTER — Other Ambulatory Visit (HOSPITAL_COMMUNITY): Payer: Self-pay | Admitting: Family Medicine

## 2024-03-16 ENCOUNTER — Telehealth: Payer: Self-pay | Admitting: Pharmacy Technician

## 2024-03-16 DIAGNOSIS — D509 Iron deficiency anemia, unspecified: Secondary | ICD-10-CM | POA: Insufficient documentation

## 2024-03-16 NOTE — Telephone Encounter (Signed)
" °  Ariel Cox, the patient will be scheduled as soon as possible.  Auth Submission: NO AUTH NEEDED Site of care: Site of care: [facility] Payer: [employer] Medication & CPT/J Code(s) submitted: Venofer (Iron Sucrose) J1756 Diagnosis Code: D50.9 Route of submission (phone, fax, portal): phone Phone # [phone] Fax # Auth type: Buy/Bill PB Units/visits requested: 200 mg x 5 Reference number: [id number] Approval from: 03/16/2024 to 06/16/2024       "

## 2024-03-16 NOTE — Progress Notes (Signed)
 Received IV iron referral. Messaged Olivia, CPhT, for confirmation on preferred IV iron formulation by payer  Sherry Pennant, PharmD, MPH, BCPS, CPP Clinical Pharmacist

## 2024-03-16 NOTE — Progress Notes (Signed)
 Per Schuyler, the preferred formulation is Venofer.  Orders placed for Venofer 200mg  IV x 5 doses at [facility]  Daina Cara, PharmD, MPH, BCPS, CPP Clinical Pharmacist

## 2024-03-24 ENCOUNTER — Ambulatory Visit

## 2024-03-24 VITALS — BP 133/84 | HR 68 | Temp 97.5°F | Resp 16 | Ht 66.0 in | Wt 249.2 lb

## 2024-03-24 DIAGNOSIS — D509 Iron deficiency anemia, unspecified: Secondary | ICD-10-CM

## 2024-03-24 MED ORDER — SODIUM CHLORIDE 0.9 % IV SOLN
200.0000 mg | Freq: Once | INTRAVENOUS | Status: AC
  Administered 2024-03-24: 200 mg via INTRAVENOUS
  Filled 2024-03-24: qty 10

## 2024-03-24 NOTE — Progress Notes (Signed)
 Diagnosis: , Acute Anemia  Provider:  Lonna Coder MD  Procedure: IV Infusion  IV Type: Peripheral, IV Location: L Antecubital  , Venofer  (Iron  Sucrose), Dose: 200 mg  Infusion Start Time: 0835  Infusion Stop Time: 0854  Post Infusion IV Care: Observation period completed and Peripheral IV Discontinued  Discharge: Condition: Good, Destination: Home . AVS Provided  Performed by:  Donny Childes, RN

## 2024-03-24 NOTE — Patient Instructions (Signed)
 Iron  Sucrose Injection What is this medication? IRON  SUCROSE (EYE ern SOO krose) treats low levels of iron  (iron  deficiency anemia) in people with kidney disease. Iron  is a mineral that plays an important role in making red blood cells, which carry oxygen from your lungs to the rest of your body. This medicine may be used for other purposes; ask your health care provider or pharmacist if you have questions. COMMON BRAND NAME(S): Venofer  What should I tell my care team before I take this medication? They need to know if you have any of these conditions: Anemia not caused by low iron  levels Heart disease High levels of iron  in the blood Kidney disease Liver disease An unusual or allergic reaction to iron , other medications, foods, dyes, or preservatives Pregnant or trying to get pregnant Breastfeeding How should I use this medication? This medication is infused into a vein. It is given by your care team in a hospital or clinic setting. Talk to your care team about the use of this medication in children. While it may be prescribed for children as young as 2 years for selected conditions, precautions do apply. Overdosage: If you think you have taken too much of this medicine contact a poison control center or emergency room at once. NOTE: This medicine is only for you. Do not share this medicine with others. What if I miss a dose? Keep appointments for follow-up doses. It is important not to miss your dose. Call your care team if you are unable to keep an appointment. What may interact with this medication? Do not take this medication with any of the following: Deferoxamine Dimercaprol Other iron  products This medication may also interact with the following: Chloramphenicol Deferasirox This list may not describe all possible interactions. Give your health care provider a list of all the medicines, herbs, non-prescription drugs, or dietary supplements you use. Also tell them if you smoke,  drink alcohol, or use illegal drugs. Some items may interact with your medicine. What should I watch for while using this medication? Your condition will be monitored carefully while you are receiving this medication. Tell your care team if your symptoms do not start to get better or if they get worse. You may need blood work done while you are taking this medication. Sometimes, when medications are infused into veins, a little can leak out of the vein and into the tissue around it. If this medication leaks, it can cause a brown or dark stain on the skin. This is not common. It may be permanent. If you feel pain or swelling during your infusion, tell your care team right away. They can stop the infusion and treat the area. You may need to eat more foods that contain iron . Talk to your care team. Foods that contain iron  include whole grains or cereals, dried fruits, beans, peas, leafy green vegetables, and organ meats (liver, kidney). What side effects may I notice from receiving this medication? Side effects that you should report to your care team as soon as possible: Allergic reactions--skin rash, itching, hives, swelling of the face, lips, tongue, or throat Low blood pressure--dizziness, feeling faint or lightheaded, blurry vision Painful swelling, warmth, or redness of the skin, brown or dark skin color at the infusion site Shortness of breath Side effects that usually do not require medical attention (report these to your care team if they continue or are bothersome): Flushing Headache Joint pain Muscle pain Nausea This list may not describe all possible side effects. Call your  doctor for medical advice about side effects. You may report side effects to FDA at 1-800-FDA-1088. Where should I keep my medication? This medication is given in a hospital or clinic. It will not be stored at home. NOTE: This sheet is a summary. It may not cover all possible information. If you have questions about  this medicine, talk to your doctor, pharmacist, or health care provider.  2025 [employer] (2023-12-23 00:00:00)

## 2024-03-29 ENCOUNTER — Ambulatory Visit

## 2024-04-01 ENCOUNTER — Ambulatory Visit

## 2024-10-12 ENCOUNTER — Other Ambulatory Visit

## 2024-10-26 ENCOUNTER — Ambulatory Visit: Payer: Self-pay | Admitting: Internal Medicine
